# Patient Record
Sex: Male | Born: 1955 | Race: White | Hispanic: No | Smoking: Current every day smoker
Health system: Southern US, Community
[De-identification: ages and names within clinical notes are randomized; demographics above are authoritative.]

## PROBLEM LIST (undated history)

## (undated) DIAGNOSIS — F1910 Other psychoactive substance abuse, uncomplicated: Secondary | ICD-10-CM

## (undated) DIAGNOSIS — J449 Chronic obstructive pulmonary disease, unspecified: Secondary | ICD-10-CM

## (undated) DIAGNOSIS — F17200 Nicotine dependence, unspecified, uncomplicated: Secondary | ICD-10-CM

## (undated) DIAGNOSIS — C801 Malignant (primary) neoplasm, unspecified: Secondary | ICD-10-CM

## (undated) DIAGNOSIS — B182 Chronic viral hepatitis C: Secondary | ICD-10-CM

## (undated) DIAGNOSIS — C140 Malignant neoplasm of pharynx, unspecified: Secondary | ICD-10-CM

## (undated) HISTORY — PX: NO PAST SURGERIES: SHX2092

---

## 2004-05-29 ENCOUNTER — Emergency Department: Payer: Self-pay | Admitting: Unknown Physician Specialty

## 2004-11-22 ENCOUNTER — Emergency Department: Payer: Self-pay

## 2006-04-16 ENCOUNTER — Emergency Department: Payer: Self-pay | Admitting: Emergency Medicine

## 2006-04-16 ENCOUNTER — Other Ambulatory Visit: Payer: Self-pay

## 2006-12-28 ENCOUNTER — Ambulatory Visit: Payer: Self-pay | Admitting: Surgery

## 2014-10-20 ENCOUNTER — Inpatient Hospital Stay
Admission: EM | Admit: 2014-10-20 | Discharge: 2014-10-22 | DRG: 189 | Disposition: A | Discharge status: Home / Self Care | Payer: Self-pay | Attending: Internal Medicine | Admitting: Internal Medicine

## 2014-10-20 ENCOUNTER — Encounter: Payer: Self-pay | Admitting: *Deleted

## 2014-10-20 ENCOUNTER — Emergency Department: Payer: Self-pay

## 2014-10-20 DIAGNOSIS — B1920 Unspecified viral hepatitis C without hepatic coma: Secondary | ICD-10-CM | POA: Diagnosis present

## 2014-10-20 DIAGNOSIS — F17210 Nicotine dependence, cigarettes, uncomplicated: Secondary | ICD-10-CM | POA: Diagnosis present

## 2014-10-20 DIAGNOSIS — J9601 Acute respiratory failure with hypoxia: Principal | ICD-10-CM

## 2014-10-20 DIAGNOSIS — R0902 Hypoxemia: Secondary | ICD-10-CM

## 2014-10-20 DIAGNOSIS — B182 Chronic viral hepatitis C: Secondary | ICD-10-CM

## 2014-10-20 DIAGNOSIS — R768 Other specified abnormal immunological findings in serum: Secondary | ICD-10-CM

## 2014-10-20 DIAGNOSIS — F1010 Alcohol abuse, uncomplicated: Secondary | ICD-10-CM | POA: Diagnosis present

## 2014-10-20 DIAGNOSIS — Z720 Tobacco use: Secondary | ICD-10-CM

## 2014-10-20 DIAGNOSIS — J441 Chronic obstructive pulmonary disease with (acute) exacerbation: Secondary | ICD-10-CM | POA: Diagnosis present

## 2014-10-20 DIAGNOSIS — J209 Acute bronchitis, unspecified: Secondary | ICD-10-CM

## 2014-10-20 DIAGNOSIS — R5383 Other fatigue: Secondary | ICD-10-CM

## 2014-10-20 DIAGNOSIS — J440 Chronic obstructive pulmonary disease with acute lower respiratory infection: Secondary | ICD-10-CM | POA: Diagnosis present

## 2014-10-20 HISTORY — DX: Chronic obstructive pulmonary disease, unspecified: J44.9

## 2014-10-20 LAB — BASIC METABOLIC PANEL
Anion gap: 9 (ref 5–15)
BUN: 10 mg/dL (ref 6–20)
CALCIUM: 9.5 mg/dL (ref 8.9–10.3)
CO2: 33 mmol/L — ABNORMAL HIGH (ref 22–32)
Chloride: 95 mmol/L — ABNORMAL LOW (ref 101–111)
Creatinine, Ser: 0.78 mg/dL (ref 0.61–1.24)
GFR calc non Af Amer: >60 mL/min (ref >60)
Glucose, Bld: 139 mg/dL — ABNORMAL HIGH (ref 65–99)
Potassium: 4.1 mmol/L (ref 3.5–5.1)
Sodium: 137 mmol/L (ref 135–145)

## 2014-10-20 LAB — HEPATIC FUNCTION PANEL
ALBUMIN: 4.5 g/dL (ref 3.5–5.0)
ALT: 20 U/L (ref 17–63)
AST: 20 U/L (ref 15–41)
Alkaline Phosphatase: 76 U/L (ref 38–126)
Bilirubin, Direct: 0.1 mg/dL (ref 0.1–0.5)
Indirect Bilirubin: 0.5 mg/dL (ref 0.3–0.9)
Total Bilirubin: 0.6 mg/dL (ref 0.3–1.2)
Total Protein: 7.5 g/dL (ref 6.5–8.1)

## 2014-10-20 LAB — CBC
HCT: 44.1 % (ref 40.0–52.0)
HEMOGLOBIN: 15.3 g/dL (ref 13.0–18.0)
MCH: 34.4 pg — ABNORMAL HIGH (ref 26.0–34.0)
MCHC: 34.7 g/dL (ref 32.0–36.0)
MCV: 98.9 fL (ref 80.0–100.0)
Platelets: 210 10*3/uL (ref 150–440)
RBC: 4.46 MIL/uL (ref 4.40–5.90)
RDW: 13 % (ref 11.5–14.5)
WBC: 6.2 10*3/uL (ref 3.8–10.6)

## 2014-10-20 LAB — URINALYSIS COMPLETE WITH MICROSCOPIC (ARMC ONLY)
BACTERIA UA: NONE SEEN
Bilirubin Urine: NEGATIVE
GLUCOSE, UA: NEGATIVE mg/dL
HGB URINE DIPSTICK: NEGATIVE
KETONES UR: NEGATIVE mg/dL
Leukocytes, UA: NEGATIVE
NITRITE: NEGATIVE
Protein, ur: NEGATIVE mg/dL
SPECIFIC GRAVITY, URINE: 1.049 — AB (ref 1.005–1.030)
Squamous Epithelial / LPF: NONE SEEN
pH: 6 (ref 5.0–8.0)

## 2014-10-20 MED ORDER — NICOTINE 10 MG IN INHA
1.0000 | RESPIRATORY_TRACT | Status: DC | PRN
  Administered 2014-10-20: 1 via RESPIRATORY_TRACT

## 2014-10-20 MED ORDER — ENOXAPARIN SODIUM 40 MG/0.4ML ~~LOC~~ SOLN
40.0000 mg | SUBCUTANEOUS | Status: DC
  Administered 2014-10-21 – 2014-10-22 (×2): 40 mg via SUBCUTANEOUS
  Filled 2014-10-20 (×2): qty 0.4

## 2014-10-20 MED ORDER — METHYLPREDNISOLONE SODIUM SUCC 125 MG IJ SOLR
125.0000 mg | Freq: Once | INTRAMUSCULAR | Status: AC
  Administered 2014-10-20: 125 mg via INTRAVENOUS
  Filled 2014-10-20: qty 2

## 2014-10-20 MED ORDER — IPRATROPIUM-ALBUTEROL 0.5-2.5 (3) MG/3ML IN SOLN
3.0000 mL | Freq: Once | RESPIRATORY_TRACT | Status: AC
Start: 2014-10-20 — End: 2014-10-20
  Administered 2014-10-20: 3 mL via RESPIRATORY_TRACT
  Filled 2014-10-20: qty 3

## 2014-10-20 MED ORDER — METHYLPREDNISOLONE SODIUM SUCC 125 MG IJ SOLR
60.0000 mg | Freq: Two times a day (BID) | INTRAMUSCULAR | Status: DC
  Administered 2014-10-21 – 2014-10-22 (×4): 60 mg via INTRAVENOUS
  Filled 2014-10-20 (×5): qty 2

## 2014-10-20 MED ORDER — DEXTROSE 5 % IV SOLN
500.0000 mg | INTRAVENOUS | Status: DC
  Administered 2014-10-21: 500 mg via INTRAVENOUS
  Filled 2014-10-20 (×2): qty 500

## 2014-10-20 MED ORDER — ACETAMINOPHEN 650 MG RE SUPP: 650.0000 mg | Freq: Four times a day (QID) | RECTAL | Status: DC | PRN

## 2014-10-20 MED ORDER — IPRATROPIUM-ALBUTEROL 0.5-2.5 (3) MG/3ML IN SOLN
3.0000 mL | Freq: Once | RESPIRATORY_TRACT | Status: AC
  Administered 2014-10-20: 3 mL via RESPIRATORY_TRACT
  Filled 2014-10-20: qty 3

## 2014-10-20 MED ORDER — SODIUM CHLORIDE 0.9 % IJ SOLN
3.0000 mL | Freq: Two times a day (BID) | INTRAMUSCULAR | Status: DC
  Administered 2014-10-21 – 2014-10-22 (×4): 3 mL via INTRAVENOUS

## 2014-10-20 MED ORDER — IPRATROPIUM-ALBUTEROL 0.5-2.5 (3) MG/3ML IN SOLN: 3.0000 mL | RESPIRATORY_TRACT | Status: DC | PRN

## 2014-10-20 MED ORDER — NICOTINE 10 MG IN INHA
RESPIRATORY_TRACT | Status: AC
  Administered 2014-10-20: 1 via RESPIRATORY_TRACT
  Filled 2014-10-20: qty 36

## 2014-10-20 MED ORDER — ACETAMINOPHEN 325 MG PO TABS: 650.0000 mg | ORAL_TABLET | Freq: Four times a day (QID) | ORAL | Status: DC | PRN

## 2014-10-20 MED ORDER — IOHEXOL 350 MG/ML SOLN
75.0000 mL | Freq: Once | INTRAVENOUS | Status: AC | PRN
  Administered 2014-10-20: 75 mL via INTRAVENOUS

## 2014-10-20 NOTE — ED Notes (Signed)
Pt not wanting to wear oxygen tubing not comfortable.

## 2014-10-20 NOTE — ED Notes (Signed)
MD at bedside. 

## 2014-10-20 NOTE — ED Notes (Signed)
Patient transported to CT 

## 2014-10-20 NOTE — ED Provider Notes (Signed)
[facility] Emergency Department Provider Note   ____________________________________________  Time seen: 5:40 PM I have reviewed the triage vital signs and the triage nursing note.  HISTORY  Chief Complaint Weakness   Historian Patient  HPI Stephen Waters is a 59 y.o. male who is complaining of several weeks of feeling weak all over. He does get weak and dyspneic when he walks short distances, and this seems to have been worsening recently. He came mostly today for evaluation of his liver. He states he tried to donate plasma and they told him that he had hepatitis C and to go see a primary care physician. He's been having trouble getting in with a primary care physician due to financial reasons. [facility] but [facility] him too much money, and was referred to [place] and has an appointment at the end of the month. He is not having specific or focal weakness. He has not had any altered mental status. He is not having specific joint problems. He has not had fever. He's been having abdominal pain, nausea, or bowel problems. 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   History reviewed. No pertinent past medical history. told he has hepatitis C  There are no active problems to display for this patient.   No past surgical history on file.  No current outpatient prescriptions on file.  Allergies Review of patient's allergies indicates no known allergies.  No family history on file.  Social History History  Substance Use Topics  . Smoking status: Current Every Day Smoker  . Smokeless tobacco: Not on file  . 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 Eyes: Negative for visual changes. ENT: Negative for sore throat. Cardiovascular: Negative for chest pain. Respiratory:  Positive for some chronic shortness of breath. Chronic cough Gastrointestinal: Negative for abdominal pain, vomiting and diarrhea. Genitourinary: Negative for dysuria. Musculoskeletal: Negative for back pain. Skin: Negative for rash. Neurological: Negative for headaches, focal weakness or numbness. 10 point Review of Systems otherwise negative ____________________________________________   PHYSICAL EXAM:  VITAL SIGNS: ED Triage Vitals  Enc Vitals Group     BP 10/20/14 1635 144/95 mmHg     Pulse Rate 10/20/14 1635 114     Resp 10/20/14 1635 22     Temp 10/20/14 1635 98.2 F (36.8 C)     Temp Source 10/20/14 1635 Oral     SpO2 10/20/14 1635 93 %     Weight 10/20/14 1635 128 lb (58.06 kg)     Height 10/20/14 1635 5\' 10"  (1.778 m)     Head Cir --      Peak Flow --      Pain Score --      Pain Loc --      Pain Edu? --      Excl. in [place]? --      Constitutional: Alert and oriented. Very thin. In no distress. Eyes: Conjunctivae are normal. PERRL. Normal extraocular movements. ENT   Head: Normocephalic and atraumatic.   Nose: No congestion/rhinnorhea.   Mouth/Throat: Mucous membranes are moist.   Neck: No stridor. Cardiovascular/Chest: Normal rate, regular rhythm.  No murmurs, rubs, or gallops. Respiratory: Normal respiratory effort without tachypnea nor retractions. Breath sounds are clear and equal bilaterally. No wheezes/rales/rhonchi. Gastrointestinal: Soft. No distention, no guarding, no rebound. Nontender  Genitourinary/rectal:Deferred Musculoskeletal: Nontender with normal range of motion in all extremities. No joint effusions.  No lower extremity tenderness nor edema. Neurologic:  Normal speech and language. No gross or focal neurologic deficits are appreciated. Skin:  Skin is warm, dry and intact. No rash noted. Psychiatric: Mood and affect are normal. Speech and behavior are normal. Patient exhibits appropriate insight and  judgment.  ____________________________________________   EKG I, Lisa Roca, MD, the attending physician have personally viewed and interpreted all ECGs.  Sinus tachycardia. 110 bpm. Narrow QRS. Normal axis. Normal ST and T-wave ____________________________________________  LABS (pertinent positives/negatives)  White blood count 6.2, hemoglobin 50.3 Hepatic function panel within normal limits Medical panel significant only for cord 95 and CO2 33  ____________________________________________  RADIOLOGY All Xrays were viewed by me. Imaging interpreted by Radiologist.  Chest x-ray portable: COPD no acute findings  CT angio chest:   IMPRESSION: 1. No evidence of acute pulmonary embolism or other acute chest process. 2. Severe emphysema with diffuse central airway thickening. 3. Nonspecific prominent right hilar lymph node, possibly reactive. This can be addressed on follow-up CT below. 4. Scattered small pulmonary nodules are not morphologically suspicious. Given risk factors for bronchogenic carcinoma, follow-up chest CT at 6 - 12 months is recommended. This recommendation follows the consensus statement: Guidelines for Management of Small Pulmonary Nodules Detected on CT Scans: A Statement from the [employer] as published in Radiology 2005;237:395-400. __________________________________________  PROCEDURES  Procedure(s) performed: None Critical Care performed: None  ____________________________________________   ED COURSE / ASSESSMENT AND PLAN  CONSULTATIONS: Hospitalist for admission  Pertinent labs & imaging results that were available during my care of the patient were reviewed by me and considered in my medical decision making (see chart for details).  Patient has generalized complaints of fatigue as difficult to tell whether or not this is a respiratory capacity issue, muscular pain or weakness, but it does not appear that he is having a focal  weakness or numbness and his neurologic exam here is normal. Upon walking his heart rate goes up into the 115-130 range, and his O2 sat dropped to mid 80s and patient was severely short of breath. He does not have active wheezing, however he is a smoker and chest x-ray is consistent with COPD. His chest x-ray does not show an acute finding, and I will go ahead and CT his chest to rule out PE.  Patient was given a DuoNeb after being seen dyspneic, but wheezing after walking, is unclear whether or not this actually helped him. His CT scan did not show PE or pneumonia, but severe emphysema. I am going to go ahead and try additional DuoNeb and Solu-Medrol for a diagnosis of COPD. Patient's need further evaluation and treatment for his hypoxia and severe dyspnea when he does any walking at all.  Patient / Family / Caregiver informed of clinical course, medical decision-making process, and agree with plan.   I discussed return precautions, follow-up instructions, and discharged instructions with patient and/or family.  ___________________________________________   FINAL CLINICAL IMPRESSION(S) / ED DIAGNOSES   Final diagnoses:  COPD exacerbation  Hypoxia  Other fatigue      Lisa Roca, MD 10/20/14 2119

## 2014-10-20 NOTE — H&P (Signed)
[place] [facility] [place] NAME: Stephen Waters    MR#:  [id number]  DATE OF BIRTH:  [birth year]  DATE OF ADMISSION:  10/20/2014  PRIMARY CARE PHYSICIAN: No primary care provider on file.   REQUESTING/REFERRING PHYSICIAN: Lord  CHIEF COMPLAINT:   Chief Complaint  Patient presents with  . Weakness    HISTORY OF PRESENT ILLNESS:  Stephen Waters  is a 59 y.o. male who presents with fatigue. Patient states that he's had intermittent bouts of severe fatigue over the past several weeks, but seemed to be worse when it is humid outside. He is a long-time smoker, and on evaluation in the ED was found to have his O2 sats dropping to the low 80s with ambulation. Chest x-ray and CTA chest show significant emphysematous changes. Patient does not have an official diagnosis by PFTs of COPD, but hospitalists were called for admission for COPD exacerbation, given his clinical scenario. Of note, the patient also states that he recently donated blood and was notified that he had a hepatitis C positive lab tests. He requests further diagnostic workup of this.  PAST MEDICAL HISTORY:   Past Medical History  Diagnosis Date  . COPD (chronic obstructive pulmonary disease)     PAST SURGICAL HISTORY:   Past Surgical History  Procedure Laterality Date  . No past surgeries      SOCIAL HISTORY:   History  Substance Use Topics  . Smoking status: Current Every Day Smoker -- 2.00 packs/day for 40 years    Types: Cigarettes  . Smokeless tobacco: Not on file  . Alcohol Use: 0.0 oz/week    0 Standard drinks or equivalent per week     Comment: 1 beer daily    FAMILY HISTORY:  Patient states that he is not aware of any significant family history of medical illness.  DRUG ALLERGIES:  No Known Allergies  MEDICATIONS AT HOME:   Prior to Admission medications   Not on File    REVIEW OF SYSTEMS:  Review of Systems  Constitutional: Positive for  malaise/fatigue. Negative for fever, chills and weight loss.  HENT: Negative for ear pain, hearing loss and tinnitus.   Eyes: Negative for blurred vision, double vision, pain and redness.  Respiratory: Positive for shortness of breath. Negative for cough and hemoptysis.   Cardiovascular: Negative for chest pain, palpitations, orthopnea and leg swelling.  Gastrointestinal: Negative for nausea, vomiting, abdominal pain, diarrhea and constipation.  Genitourinary: Negative for dysuria, frequency and hematuria.  Musculoskeletal: Negative for back pain, joint pain and neck pain.  Skin:       No acne, rash, or lesions  Neurological: Negative for dizziness, tremors, focal weakness and weakness.  Endo/Heme/Allergies: Negative for polydipsia. Does not bruise/bleed easily.  Psychiatric/Behavioral: Negative for depression. The patient is not nervous/anxious and does not have insomnia.      VITAL SIGNS:   Filed Vitals:   10/20/14 1900 10/20/14 2000 10/20/14 2030 10/20/14 2130  BP: 139/100 137/83 138/79 143/85  Pulse: 94 79 93 69  Temp:      TempSrc:      Resp: 20 20 22    Height:      Weight:      SpO2: 98% 98% 90% 96%   Wt Readings from Last 3 Encounters:  10/20/14 58.06 kg (128 lb)    PHYSICAL EXAMINATION:  Physical Exam  Constitutional: He is oriented to person, place, and time. He appears well-developed and well-nourished. No distress.  HENT:  Head:  Normocephalic and atraumatic.  Mouth/Throat: Oropharynx is clear and moist.  Eyes: Conjunctivae and EOM are normal. Pupils are equal, round, and reactive to light. No scleral icterus.  Neck: Normal range of motion. Neck supple. No JVD present. No thyromegaly present.  Cardiovascular: Normal rate, regular rhythm and intact distal pulses.  Exam reveals no gallop and no friction rub.   No murmur heard. Respiratory: Effort normal. No respiratory distress. He has no wheezes. He has no rales.  Moderate air movement, with somewhat distant breath  sounds.  GI: Soft. Bowel sounds are normal. He exhibits no distension. There is no tenderness.  Musculoskeletal: Normal range of motion. He exhibits no edema.  No arthritis, no gout  Lymphadenopathy:    He has no cervical adenopathy.  Neurological: He is alert and oriented to person, place, and time. No cranial nerve deficit.  No dysarthria, no aphasia  Skin: Skin is warm and dry. No rash noted. No erythema.  Psychiatric: He has a normal mood and affect. His behavior is normal. Judgment and thought content normal.    LABORATORY PANEL:   CBC  Recent Labs Lab 10/20/14 1639  WBC 6.2  HGB 15.3  HCT 44.1  PLT 210   ------------------------------------------------------------------------------------------------------------------  Chemistries   Recent Labs Lab 10/20/14 1639  NA 137  K 4.1  CL 95*  CO2 33*  GLUCOSE 139*  BUN 10  CREATININE 0.78  CALCIUM 9.5  AST 20  ALT 20  ALKPHOS 76  BILITOT 0.6   ------------------------------------------------------------------------------------------------------------------  Cardiac Enzymes No results for input(s): TROPONINI in the last 168 hours. ------------------------------------------------------------------------------------------------------------------  RADIOLOGY:  Dg Chest 2 View  10/20/2014   CLINICAL DATA:  Weakness with fatigue for 6 months or longer, worse today. Chronic cough. Smoker. Initial encounter.  EXAM: CHEST  2 VIEW  COMPARISON:  04/16/2006 radiographs.  FINDINGS: The heart size and mediastinal contours are stable. The lungs are hyperinflated. There is increased interstitial prominence at both lung bases without confluent airspace opacity or pleural effusion. There is a possible small air cyst in the right suprahilar region. The pulmonary vascularity remains normal. Mild superior endplate compression deformities in the upper lumbar spine are unchanged.  IMPRESSION: Chronic obstructive pulmonary disease with  increased interstitial prominence at both lung bases since 2008, likely chronic. No definite edema or superimposed infiltrate.   Electronically Signed   By: Richardean Sale M.D.   On: 10/20/2014 18:38   Ct Angio Chest Pe W/cm &/or Wo Cm  10/20/2014   CLINICAL DATA:  Tachycardia and hypoxemia. Nonproductive cough and weakness. Question pulmonary embolism. Initial encounter.  EXAM: CT ANGIOGRAPHY CHEST WITH CONTRAST  TECHNIQUE: Multidetector CT imaging of the chest was performed using the standard protocol during bolus administration of intravenous contrast. Multiplanar CT image reconstructions and MIPs were obtained to evaluate the vascular anatomy.  CONTRAST:  1mL OMNIPAQUE IOHEXOL 350 MG/ML SOLN  COMPARISON:  Radiographs 10/20/2014 and 04/16/2006.  FINDINGS: Mediastinum: The pulmonary arteries are well opacified with contrast. There is no evidence of acute pulmonary embolism. There is minimal atherosclerosis of the aorta and coronary arteries. The heart size is normal. There is no pericardial effusion. There is a prominent right hilar lymph node, measuring up to 1.5 cm on image 82 of series 5. No enlarged mediastinal or axillary lymph nodes demonstrated. The thyroid gland, trachea and esophagus demonstrate no significant findings.  Lungs/Pleura: There is no pleural effusion.There is severe centrilobular and paraseptal emphysema with diffuse bronchial wall thickening. 5 mm perifissural nodule along the minor fissure on  image 110 is likely a lymph node. There are scattered tiny subpleural nodules in the left lung, likely postinflammatory. No morphologically suspicious nodules or airspace opacity identified.  Upper abdomen: Unremarkable.  There is no adrenal mass.  Musculoskeletal/Chest wall: No chest wall lesion or acute osseous findings.Mild superior endplate compression deformities in the upper lumbar spine are stable from prior chest radiographs. Paucity of subcutaneous fat noted.  Review of the MIP images  confirms the above findings.  IMPRESSION: 1. No evidence of acute pulmonary embolism or other acute chest process. 2. Severe emphysema with diffuse central airway thickening. 3. Nonspecific prominent right hilar lymph node, possibly reactive. This can be addressed on follow-up CT below. 4. Scattered small pulmonary nodules are not morphologically suspicious. Given risk factors for bronchogenic carcinoma, follow-up chest CT at 6 - 12 months is recommended. This recommendation follows the consensus statement: Guidelines for Management of Small Pulmonary Nodules Detected on CT Scans: A Statement from the [employer] as published in Radiology 2005;237:395-400.   Electronically Signed   By: Richardean Sale M.D.   On: 10/20/2014 20:51    EKG:   Orders placed or performed during the hospital encounter of 10/20/14  . ED EKG  . ED EKG    IMPRESSION AND PLAN:  Principal Problem:   COPD exacerbation - without significant wheezing or sputum production, she states has not been coughing too much recently, but that he has had significant fatigue with some intermittent shortness of breath, especially with exertion. His nightly chest pain, his EKG was not suspicious for ischemia, but we'll check a troponin. He was given IV Solu-Medrol and DuoNeb's in the ED with some improvement in his oxygenation status. We will continue these medications and add azithromycin, and then see how well he is able to ambulate tomorrow. Active Problems:   Carrier or suspected carrier of hepatitis C - start hepatitis C antibody, if positive then we'll get hepatitis quantitative viral load.  All the records are reviewed and case discussed with ED provider. Management plans discussed with the patient and/or family.  DVT PROPHYLAXIS: SubQ lovenox  ADMISSION STATUS: Observation  CODE STATUS: Full  TOTAL TIME TAKING CARE OF THIS PATIENT: 40 minutes.    Tan [place] 10/20/2014, 10:18 PM  [facility]   Office  [phone]  CC: Primary care physician; No primary care provider on file.

## 2014-10-20 NOTE — ED Notes (Signed)
Pt to triage via wheelchair.  Pt states he is weak.  States he had to leave work to come to hospital.  Pt states unable to stand for any period of time.  cig smoker.  No chest pain.  Non productive cough.  No fever.  Pt states i just feel weak.

## 2014-10-20 NOTE — ED Notes (Signed)
Walked pt in hall with pulse ox attached. Walked 50 feet heart rate increased from 95 to 107, sats from 92% to 85%. Pt very winded at end. Dr Reita Cliche notified.

## 2014-10-21 LAB — CBC
HCT: 43.8 % (ref 40.0–52.0)
HEMATOCRIT: 44.7 % (ref 40.0–52.0)
Hemoglobin: 15 g/dL (ref 13.0–18.0)
Hemoglobin: 15.3 g/dL (ref 13.0–18.0)
MCH: 34.6 pg — ABNORMAL HIGH (ref 26.0–34.0)
MCH: 33.4 pg (ref 26.0–34.0)
MCHC: 33.5 g/dL (ref 32.0–36.0)
MCHC: 34.8 g/dL (ref 32.0–36.0)
MCV: 99.3 fL (ref 80.0–100.0)
MCV: 99.8 fL (ref 80.0–100.0)
PLATELETS: 195 10*3/uL (ref 150–440)
Platelets: 200 10*3/uL (ref 150–440)
RBC: 4.42 MIL/uL (ref 4.40–5.90)
RBC: 4.48 MIL/uL (ref 4.40–5.90)
RDW: 13.2 % (ref 11.5–14.5)
RDW: 13.3 % (ref 11.5–14.5)
WBC: 4.2 10*3/uL (ref 3.8–10.6)
WBC: 5.7 10*3/uL (ref 3.8–10.6)

## 2014-10-21 LAB — CREATININE, SERUM: Creatinine, Ser: 0.59 mg/dL — ABNORMAL LOW (ref 0.61–1.24)

## 2014-10-21 LAB — BASIC METABOLIC PANEL
Anion gap: 5 (ref 5–15)
BUN: 11 mg/dL (ref 6–20)
CALCIUM: 9.2 mg/dL (ref 8.9–10.3)
CHLORIDE: 100 mmol/L — AB (ref 101–111)
CO2: 32 mmol/L (ref 22–32)
Creatinine, Ser: 0.63 mg/dL (ref 0.61–1.24)
GFR calc Af Amer: >60 mL/min (ref >60)
GFR calc non Af Amer: >60 mL/min (ref >60)
Glucose, Bld: 179 mg/dL — ABNORMAL HIGH (ref 65–99)
Potassium: 4 mmol/L (ref 3.5–5.1)
SODIUM: 137 mmol/L (ref 135–145)

## 2014-10-21 LAB — TROPONIN I: Troponin I: <0.03 ng/mL (ref <0.031)

## 2014-10-21 MED ORDER — DEXTROSE 5 % IV SOLN
500.0000 mg | INTRAVENOUS | Status: DC
  Filled 2014-10-21: qty 500

## 2014-10-21 MED ORDER — TIOTROPIUM BROMIDE MONOHYDRATE 18 MCG IN CAPS
18.0000 ug | ORAL_CAPSULE | Freq: Every day | RESPIRATORY_TRACT | Status: DC
  Administered 2014-10-21 – 2014-10-22 (×2): 18 ug via RESPIRATORY_TRACT
  Filled 2014-10-21: qty 5

## 2014-10-21 MED ORDER — NICOTINE 10 MG IN INHA: 1.0000 | RESPIRATORY_TRACT | Status: DC | PRN

## 2014-10-21 MED ORDER — BUDESONIDE-FORMOTEROL FUMARATE 160-4.5 MCG/ACT IN AERO
2.0000 | INHALATION_SPRAY | Freq: Two times a day (BID) | RESPIRATORY_TRACT | Status: DC
  Administered 2014-10-21 – 2014-10-22 (×3): 2 via RESPIRATORY_TRACT
  Filled 2014-10-21: qty 6

## 2014-10-21 MED ORDER — AZITHROMYCIN 250 MG PO TABS
500.0000 mg | ORAL_TABLET | ORAL | Status: DC
  Administered 2014-10-21: 500 mg via ORAL
  Filled 2014-10-21 (×2): qty 2

## 2014-10-21 MED ORDER — NICOTINE 21 MG/24HR TD PT24
21.0000 mg | MEDICATED_PATCH | Freq: Every day | TRANSDERMAL | Status: DC
  Administered 2014-10-21 – 2014-10-22 (×2): 21 mg via TRANSDERMAL
  Filled 2014-10-21 (×2): qty 1

## 2014-10-21 NOTE — Clinical Social Work Note (Signed)
CSW consulted for financial assistance and follow up MD care for new dx of hep C. CSW has referred this to RN CM. Please reconsult CSW if needed. Shela Leff MSW,LCSWA [phone]

## 2014-10-21 NOTE — Care Management Note (Signed)
Case Management Note  Patient Details  Name: Stephen Waters MRN: [id number] Date of Birth: 05/02/1955  Subjective/Objective:                  Met with patient to discuss discharge planning. Patient recently found out he has HCV and has sought medical assistance in the community for self-pay. [facility] he states turned him down due to "making too much money". He states he pays 100$ weekly rental fee for his home and makes 10$/hour but most goes out in taxes per patient. 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 He states he has an upcoming appointment at [facility] on [place] October 28, 2014. Oxygen is new for patient and states concern with "not being able to work with O2". He is independent with daily activities.  Action/Plan: Encouraged patient to keep appointment on 19th due to recent diagnosis. Encouraged patient to stop smoking. RN attempting to wean patient off O2. Instructed patient that if home O2 is needed he would need to follow up with PCP to see when/if O2 could be discontinued. He states he has about 60$ per week to "live on" including "gas" to get to work. RNCM will continue to monitor Rx at discharge and need for home O2.     Expected Discharge Date:                  Expected Discharge Plan:     In-House Referral:     Discharge planning Services  CM Consult  Post Acute Care Choice:    Choice offered to:  Patient  DME Arranged:    DME Agency:     HH Arranged:    [place] Agency:     Status of Service:     Medicare Important Message Given:    Date Medicare IM Given:    Medicare IM give by:    Date Additional Medicare IM Given:    Additional Medicare Important Message give by:     If discussed at Drain of Stay Meetings, dates discussed:    Additional Comments:  Marshell Garfinkel, RN 10/21/2014, 9:50 AM

## 2014-10-21 NOTE — Consult Note (Signed)
Subjective: Patient seen for recent diagnosis of hepatitis C. Patient seen and examined chart reviewed. Please see full GI consult by Mrs. London. Review with recommendations.  Objective: Vital signs in last 24 hours: Temp:  [97.5 F (36.4 C)-99.4 F (37.4 C)] 99.4 F (37.4 C) (07/12 1954) Pulse Rate:  [74-96] 93 (07/12 1954) Resp:  [17-22] 18 (07/12 1954) BP: (110-139)/(59-87) 117/87 mmHg (07/12 1954) SpO2:  [88 %-96 %] 93 % (07/12 1954) Blood pressure 117/87, pulse 93, temperature 99.4 F (37.4 C), temperature source Oral, resp. rate 18, height 5\' 10"  (1.778 m), weight 58.06 kg (128 lb), SpO2 93 %.   Intake/Output from previous day:    Intake/Output this shift:     General appearance:  Anxious-appearing 59 year old male no acute distress Resp:  Clear to auscultation Cardio:  Regular rate and rhythm GI:  Soft nontender nondistended bowel sounds positive normoactive Extremities:     Lab Results: Results for orders placed or performed during the hospital encounter of 10/20/14 (from the past 24 hour(s))  CBC     Status: None   Collection Time: 10/21/14 12:14 AM  Result Value Ref Range   WBC 5.7 3.8 - 10.6 K/uL   RBC 4.48 4.40 - 5.90 MIL/uL   Hemoglobin 15.0 13.0 - 18.0 g/dL   HCT 44.7 40.0 - 52.0 %   MCV 99.8 80.0 - 100.0 fL   MCH 33.4 26.0 - 34.0 pg   MCHC 33.5 32.0 - 36.0 g/dL   RDW 13.3 11.5 - 14.5 %   Platelets 200 150 - 440 K/uL  Creatinine, serum     Status: Abnormal   Collection Time: 10/21/14 12:14 AM  Result Value Ref Range   Creatinine, Ser 0.59 (L) 0.61 - 1.24 mg/dL   GFR calc non Af Amer >60 >60 mL/min   GFR calc Af Amer >60 >60 mL/min  Troponin I     Status: None   Collection Time: 10/21/14 12:14 AM  Result Value Ref Range   Troponin I <0.03 <0.031 ng/mL  Basic metabolic panel     Status: Abnormal   Collection Time: 10/21/14  5:04 AM  Result Value Ref Range   Sodium 137 135 - 145 mmol/L   Potassium 4.0 3.5 - 5.1 mmol/L   Chloride 100 (L) 101 - 111  mmol/L   CO2 32 22 - 32 mmol/L   Glucose, Bld 179 (H) 65 - 99 mg/dL   BUN 11 6 - 20 mg/dL   Creatinine, Ser 0.63 0.61 - 1.24 mg/dL   Calcium 9.2 8.9 - 10.3 mg/dL   GFR calc non Af Amer >60 >60 mL/min   GFR calc Af Amer >60 >60 mL/min   Anion gap 5 5 - 15  CBC     Status: Abnormal   Collection Time: 10/21/14  5:04 AM  Result Value Ref Range   WBC 4.2 3.8 - 10.6 K/uL   RBC 4.42 4.40 - 5.90 MIL/uL   Hemoglobin 15.3 13.0 - 18.0 g/dL   HCT 43.8 40.0 - 52.0 %   MCV 99.3 80.0 - 100.0 fL   MCH 34.6 (H) 26.0 - 34.0 pg   MCHC 34.8 32.0 - 36.0 g/dL   RDW 13.2 11.5 - 14.5 %   Platelets 195 150 - 440 K/uL      Recent Labs  10/20/14 1639 10/21/14 0014 10/21/14 0504  WBC 6.2 5.7 4.2  HGB 15.3 15.0 15.3  HCT 44.1 44.7 43.8  PLT 210 200 195   BMET  Recent Labs  10/20/14  1639 10/21/14 0014 10/21/14 0504  NA 137  --  137  K 4.1  --  4.0  CL 95*  --  100*  CO2 33*  --  32  GLUCOSE 139*  --  179*  BUN 10  --  11  CREATININE 0.78 0.59* 0.63  CALCIUM 9.5  --  9.2   LFT  Recent Labs  10/20/14 1639  PROT 7.5  ALBUMIN 4.5  AST 20  ALT 20  ALKPHOS 76  BILITOT 0.6  BILIDIR 0.1  IBILI 0.5   PT/INR No results for input(s): LABPROT, INR in the last 72 hours. Hepatitis Panel No results for input(s): HEPBSAG, HCVAB, HEPAIGM, HEPBIGM in the last 72 hours. C-Diff No results for input(s): CDIFFTOX in the last 72 hours. No results for input(s): CDIFFPCR in the last 72 hours.   Studies/Results: Dg Chest 2 View  10/20/2014   CLINICAL DATA:  Weakness with fatigue for 6 months or longer, worse today. Chronic cough. Smoker. Initial encounter.  EXAM: CHEST  2 VIEW  COMPARISON:  04/16/2006 radiographs.  FINDINGS: The heart size and mediastinal contours are stable. The lungs are hyperinflated. There is increased interstitial prominence at both lung bases without confluent airspace opacity or pleural effusion. There is a possible small air cyst in the right suprahilar region. The  pulmonary vascularity remains normal. Mild superior endplate compression deformities in the upper lumbar spine are unchanged.  IMPRESSION: Chronic obstructive pulmonary disease with increased interstitial prominence at both lung bases since 2008, likely chronic. No definite edema or superimposed infiltrate.   Electronically Signed   By: Richardean Sale M.D.   On: 10/20/2014 18:38   Ct Angio Chest Pe W/cm &/or Wo Cm  10/20/2014   CLINICAL DATA:  Tachycardia and hypoxemia. Nonproductive cough and weakness. Question pulmonary embolism. Initial encounter.  EXAM: CT ANGIOGRAPHY CHEST WITH CONTRAST  TECHNIQUE: Multidetector CT imaging of the chest was performed using the standard protocol during bolus administration of intravenous contrast. Multiplanar CT image reconstructions and MIPs were obtained to evaluate the vascular anatomy.  CONTRAST:  66mL OMNIPAQUE IOHEXOL 350 MG/ML SOLN  COMPARISON:  Radiographs 10/20/2014 and 04/16/2006.  FINDINGS: Mediastinum: The pulmonary arteries are well opacified with contrast. There is no evidence of acute pulmonary embolism. There is minimal atherosclerosis of the aorta and coronary arteries. The heart size is normal. There is no pericardial effusion. There is a prominent right hilar lymph node, measuring up to 1.5 cm on image 82 of series 5. No enlarged mediastinal or axillary lymph nodes demonstrated. The thyroid gland, trachea and esophagus demonstrate no significant findings.  Lungs/Pleura: There is no pleural effusion.There is severe centrilobular and paraseptal emphysema with diffuse bronchial wall thickening. 5 mm perifissural nodule along the minor fissure on image 110 is likely a lymph node. There are scattered tiny subpleural nodules in the left lung, likely postinflammatory. No morphologically suspicious nodules or airspace opacity identified.  Upper abdomen: Unremarkable.  There is no adrenal mass.  Musculoskeletal/Chest wall: No chest wall lesion or acute osseous  findings.Mild superior endplate compression deformities in the upper lumbar spine are stable from prior chest radiographs. Paucity of subcutaneous fat noted.  Review of the MIP images confirms the above findings.  IMPRESSION: 1. No evidence of acute pulmonary embolism or other acute chest process. 2. Severe emphysema with diffuse central airway thickening. 3. Nonspecific prominent right hilar lymph node, possibly reactive. This can be addressed on follow-up CT below. 4. Scattered small pulmonary nodules are not morphologically suspicious. Given risk factors  for bronchogenic carcinoma, follow-up chest CT at 6 - 12 months is recommended. This recommendation follows the consensus statement: Guidelines for Management of Small Pulmonary Nodules Detected on CT Scans: A Statement from the [place] as published in Radiology 2005;237:395-400.   Electronically Signed   By: Richardean Sale M.D.   On: 10/20/2014 20:51    Scheduled Inpatient Medications:   . azithromycin  500 mg Oral Q24H  . budesonide-formoterol  2 puff Inhalation BID  . enoxaparin (LOVENOX) injection  40 mg Subcutaneous Q24H  . methylPREDNISolone sodium succinate  60 mg Intravenous Q12H  . nicotine  21 mg Transdermal Daily  . sodium chloride  3 mL Intravenous Q12H  . tiotropium  18 mcg Inhalation Daily    Continuous Inpatient Infusions:     PRN Inpatient Medications:  acetaminophen **OR** acetaminophen, ipratropium-albuterol, nicotine  Miscellaneous:   Assessment:  1. New diagnosis of hepatitis C. Further evaluation as outlined GI consult with GI outpatient follow-up. Other recommendation to discontinue alcohol.  Plan:  1. As above.  Lollie Sails MD 10/21/2014, 10:42 PM

## 2014-10-21 NOTE — Progress Notes (Signed)
[facility] [place] NAME: Stephen Waters    MR#:  [id number]  DATE OF BIRTH:  Mar 31, 1956  SUBJECTIVE:  CHIEF COMPLAINT:   Chief Complaint  Patient presents with  . Weakness   Patient was seen earlier today Patient is 59 year old male who presented with fatigue. He was evaluated by emergency room physician and was found to have O2 sats as low as 80s with ambulation. Chest x-ray that was done as well as CTA of the chest showing emphysema. Patient was noted to be wheezing and having rails on lung exam. He was admitted to the hospital with diagnosis of COPD exacerbation. Smokes 2 packs of cigarettes a day since age of 68, for approximately 67 years calculated expenditure of $140,000 over his lifetime Patient is also concerned about recent diagnosis of hepatitis C he has been going through a lot of difficulties trying to establish physician and the care for his hepatitis C disease, unfortunately, because of insurance and the ability to pay status, he was not able to get care in [facility] department or [facility], now he is requesting some help for treatment of his hepatitis C.   Review of Systems  Constitutional: Negative for fever, chills and weight loss.  HENT: Negative for congestion.   Eyes: Negative for blurred vision and double vision.  Respiratory: Positive for cough, sputum production, shortness of breath and wheezing.   Cardiovascular: Negative for chest pain, palpitations, orthopnea, leg swelling and PND.  Gastrointestinal: Negative for nausea, vomiting, abdominal pain, diarrhea, constipation and blood in stool.  Genitourinary: Negative for dysuria, urgency, frequency and hematuria.  Musculoskeletal: Negative for falls.  Neurological: Negative for dizziness, tremors, focal weakness and headaches.  Endo/Heme/Allergies: Does not bruise/bleed easily.  Psychiatric/Behavioral: Negative for depression. The patient does not have  insomnia.     VITAL SIGNS: Blood pressure 116/71, pulse 90, temperature 98.1 F (36.7 C), temperature source Oral, resp. rate 18, height 5\' 10"  (1.778 m), weight 58.06 kg (128 lb), SpO2 92 %.  PHYSICAL EXAMINATION:   GENERAL:  59 y.o.-year-old patient lying in the bed with no acute distress.  EYES: Pupils equal, round, reactive to light and accommodation. No scleral icterus. Extraocular muscles intact.  HEENT: Head atraumatic, normocephalic. Oropharynx and nasopharynx clear.  NECK:  Supple, no jugular venous distention. No thyroid enlargement, no tenderness.  LUNGS: Diminished breath sounds bilaterally, anterior intermittent, more left-sided wheezing, bilateral posteriorrhonchi , no crepitations. Markedly diminished breath sounds bilaterally posteriorly . Using accessory muscles of respiration on exertion or with longer speech.  CARDIOVASCULAR: S1, S2 normal. 2/6 systolic murmur noted precordially, no rubs, or gallops.  ABDOMEN: Soft, nontender, nondistended. Some guarding in upper abdomen. wel sounds present. No organomegaly or mass.  EXTREMITIES: No pedal edema, cyanosis, or clubbing.  NEUROLOGIC: Cranial nerves II through XII are intact. Muscle strength 5/5 in all extremities. Sensation intact. Gait not checked.  PSYCHIATRIC: The patient is alert and oriented x 3.  SKIN: No obvious rash, lesion, or ulcer.   ORDERS/RESULTS REVIEWED:   CBC  Recent Labs Lab 10/20/14 1639 10/21/14 0014 10/21/14 0504  WBC 6.2 5.7 4.2  HGB 15.3 15.0 15.3  HCT 44.1 44.7 43.8  PLT 210 200 195  MCV 98.9 99.8 99.3  MCH 34.4* 33.4 34.6*  MCHC 34.7 33.5 34.8  RDW 13.0 13.3 13.2   ------------------------------------------------------------------------------------------------------------------  Chemistries   Recent Labs Lab 10/20/14 1639 10/21/14 0014 10/21/14 0504  NA 137  --  137  K 4.1  --  4.0  CL 95*  --  100*  CO2 33*  --  32  GLUCOSE 139*  --  179*  BUN 10  --  11  CREATININE 0.78  0.59* 0.63  CALCIUM 9.5  --  9.2  AST 20  --   --   ALT 20  --   --   ALKPHOS 76  --   --   BILITOT 0.6  --   --    ------------------------------------------------------------------------------------------------------------------ estimated creatinine clearance is 81.7 mL/min (by C-G formula based on Cr of 0.63). ------------------------------------------------------------------------------------------------------------------ No results for input(s): TSH, T4TOTAL, T3FREE, THYROIDAB in the last 72 hours.  Invalid input(s): FREET3  Cardiac Enzymes  Recent Labs Lab 10/21/14 0014  TROPONINI <0.03   ------------------------------------------------------------------------------------------------------------------ Invalid input(s): POCBNP ---------------------------------------------------------------------------------------------------------------  RADIOLOGY: Dg Chest 2 View  10/20/2014   CLINICAL DATA:  Weakness with fatigue for 6 months or longer, worse today. Chronic cough. Smoker. Initial encounter.  EXAM: CHEST  2 VIEW  COMPARISON:  04/16/2006 radiographs.  FINDINGS: The heart size and mediastinal contours are stable. The lungs are hyperinflated. There is increased interstitial prominence at both lung bases without confluent airspace opacity or pleural effusion. There is a possible small air cyst in the right suprahilar region. The pulmonary vascularity remains normal. Mild superior endplate compression deformities in the upper lumbar spine are unchanged.  IMPRESSION: Chronic obstructive pulmonary disease with increased interstitial prominence at both lung bases since 2008, likely chronic. No definite edema or superimposed infiltrate.   Electronically Signed   By: Richardean Sale M.D.   On: 10/20/2014 18:38   Ct Angio Chest Pe W/cm &/or Wo Cm  10/20/2014   CLINICAL DATA:  Tachycardia and hypoxemia. Nonproductive cough and weakness. Question pulmonary embolism. Initial encounter.  EXAM: CT  ANGIOGRAPHY CHEST WITH CONTRAST  TECHNIQUE: Multidetector CT imaging of the chest was performed using the standard protocol during bolus administration of intravenous contrast. Multiplanar CT image reconstructions and MIPs were obtained to evaluate the vascular anatomy.  CONTRAST:  55mL OMNIPAQUE IOHEXOL 350 MG/ML SOLN  COMPARISON:  Radiographs 10/20/2014 and 04/16/2006.  FINDINGS: Mediastinum: The pulmonary arteries are well opacified with contrast. There is no evidence of acute pulmonary embolism. There is minimal atherosclerosis of the aorta and coronary arteries. The heart size is normal. There is no pericardial effusion. There is a prominent right hilar lymph node, measuring up to 1.5 cm on image 82 of series 5. No enlarged mediastinal or axillary lymph nodes demonstrated. The thyroid gland, trachea and esophagus demonstrate no significant findings.  Lungs/Pleura: There is no pleural effusion.There is severe centrilobular and paraseptal emphysema with diffuse bronchial wall thickening. 5 mm perifissural nodule along the minor fissure on image 110 is likely a lymph node. There are scattered tiny subpleural nodules in the left lung, likely postinflammatory. No morphologically suspicious nodules or airspace opacity identified.  Upper abdomen: Unremarkable.  There is no adrenal mass.  Musculoskeletal/Chest wall: No chest wall lesion or acute osseous findings.Mild superior endplate compression deformities in the upper lumbar spine are stable from prior chest radiographs. Paucity of subcutaneous fat noted.  Review of the MIP images confirms the above findings.  IMPRESSION: 1. No evidence of acute pulmonary embolism or other acute chest process. 2. Severe emphysema with diffuse central airway thickening. 3. Nonspecific prominent right hilar lymph node, possibly reactive. This can be addressed on follow-up CT below. 4. Scattered small pulmonary nodules are not morphologically suspicious. Given risk factors for  bronchogenic carcinoma, follow-up chest CT at 6 - 12 months  is recommended. This recommendation follows the consensus statement: Guidelines for Management of Small Pulmonary Nodules Detected on CT Scans: A Statement from the [place] as published in Radiology 2005;237:395-400.   Electronically Signed   By: Richardean Sale M.D.   On: 10/20/2014 20:51    EKG:  Orders placed or performed during the hospital encounter of 10/20/14  . ED EKG  . ED EKG  . EKG 12-Lead  . EKG 12-Lead    ASSESSMENT AND PLAN:  Principal Problem:   COPD exacerbation Active Problems:   Carrier or suspected carrier of hepatitis C 1. Acute respiratory failure with hypoxia due to COPD exacerbation, continue patient on nebulizers at the [facility] as well as tiotropium, continue steroids, continue oxygen therapy as needed, keeping his pulse oximetry at around 94% and above 2. Acute bronchitis. Continue patient on erythromycin. Get sputum cultures if possible 3. COPD exacerbation as above. We'll continue steroids, inhalation therapy, nebulizers, follow clinically 4. Smoking counseling. Discussed this patient for approximately 5 minutes. Nicotine replacement therapy will be initiated for which patient is agreeable 5. History of hepatitis C infection. Will get gastroenterologist involved for further recommendations and possibly help. We will ask social workers care managers involved with help of establishing physician who treats hepatitis C infections. Questionable Dr. Ola Spurr versus gastroenterologist, Dr. Gustavo Lah Management plans discussed with the patient, family and they are in agreement.   DRUG ALLERGIES: No Known Allergies  CODE STATUS:     Code Status Orders        Start     Ordered   10/20/14 2318  Full code   Continuous     10/20/14 2317      TOTAL TIME TAKING CARE OF THIS PATIENT: 45 nutes.    Theodoro Grist M.D on 10/21/2014 at 6:58 PM  Between 7am to 6pm - Pager - [phone]  After  6pm go to [email or web address] - password [email or web address] [facility]  [facility]  Office  [phone]  CC: Primary care physician; No primary care provider on file.

## 2014-10-21 NOTE — Consult Note (Signed)
Consultation  Referring Provider:  Dr Ether Griffins   Admit date7/11/16 Consult date     10/21/14    Reason for Consultation:     HCV         HPI:   Stephen Waters is a 59 y.o. male admitted with COPD exacerbation. 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 Says he found this out when he tried to donate blood at a center on [address], can't remember name. Tried to have this followed up with at the health department and the [facility], but was unsuccessful. Has appt on July 19th to discuss this at [facility]. States his co-workers are worried that he will infect them. States he is not having any abdominal pain, NVD, melena/ hematochezia, dyspepsia, nor any other GI related complaint. [employer] reports he had a brief illness with jaundice as a teenager. Unsure if this was a viral hepatitis or not. Says it resolved after a couple of days. Has tattoos. Admits to being incarcerated for DWI at one time, endorses a beer or 2 after work on a qd basis. Denies intranasal cocaine/IVDU, sex partners with viral hepatitis, blood transfusion, dialysis, military/health care work. No family history of liver disease. Never has been overseas.    Past Medical History  Diagnosis Date  . COPD (chronic obstructive pulmonary disease)     Past Surgical History  Procedure Laterality Date  . No past surgeries      History reviewed. No pertinent family history. There is no family history of liver disease.  History  Substance Use Topics  . Smoking status: Current Every Day Smoker -- 2.00 packs/day for 40 years    Types: Cigarettes  . Smokeless tobacco: Not on file  . Alcohol Use: 0.0 oz/week    0 Standard drinks or equivalent per week     Comment: 1 beer daily    Prior to Admission medications   Not on File    Current Facility-Administered Medications  Medication Dose Route Frequency Provider Last Rate Last Dose  . acetaminophen (TYLENOL) tablet 650 mg  650 mg  Oral Q6H PRN Lance Coon, MD       Or  . acetaminophen (TYLENOL) suppository 650 mg  650 mg Rectal Q6H PRN Lance Coon, MD      . azithromycin (ZITHROMAX) 500 mg in dextrose 5 % 250 mL IVPB  500 mg Intravenous Q24H Theodoro Grist, MD      . budesonide-formoterol (SYMBICORT) 160-4.5 MCG/ACT inhaler 2 puff  2 puff Inhalation BID Theodoro Grist, MD   2 puff at 10/21/14 1000  . enoxaparin (LOVENOX) injection 40 mg  40 mg Subcutaneous Q24H Lance Coon, MD   40 mg at 10/21/14 0041  . ipratropium-albuterol (DUONEB) 0.5-2.5 (3) MG/3ML nebulizer solution 3 mL  3 mL Nebulization Q4H PRN Lance Coon, MD      . methylPREDNISolone sodium succinate (SOLU-MEDROL) 125 mg/2 mL injection 60 mg  60 mg Intravenous Q12H Lance Coon, MD   60 mg at 10/21/14 0959  . nicotine (NICODERM CQ - dosed in mg/24 hours) patch 21 mg  21 mg Transdermal Daily Theodoro Grist, MD   21 mg at 10/21/14 0959  . nicotine (NICOTROL) 10 MG inhaler 1 continuous puffing  1 continuous puffing Inhalation PRN Lisa Roca, MD   1 continuous puffing at 10/20/14 2130  . sodium chloride 0.9 % injection 3 mL  3 mL Intravenous Q12H Lance Coon, MD   3 mL at 10/21/14 1001  .  tiotropium (SPIRIVA) inhalation capsule 18 mcg  18 mcg Inhalation Daily Theodoro Grist, MD   18 mcg at 10/21/14 1000    Allergies as of 10/20/2014  . (No Known Allergies)     Review of Systems:    All systems reviewed and negative except where noted in HPI, with the exception of sob, fatigue, anxiety over health, insomnia.      Physical Exam:  Vital signs in last 24 hours: Temp:  [97.5 F (36.4 C)-98.2 F (36.8 C)] 98.1 F (36.7 C) (07/12 1443) Pulse Rate:  [69-114] 90 (07/12 1443) Resp:  [17-22] 18 (07/12 1443) BP: (110-144)/(59-100) 116/71 mmHg (07/12 1443) SpO2:  [87 %-98 %] 92 % (07/12 1443) Weight:  [58.06 kg (128 lb)] 58.06 kg (128 lb) (07/11 1635) Last BM Date: 10/20/14 General:   Pleasant man in NAD Head:  Normocephalic and atraumatic. Eyes:   No  icterus.   Conjunctiva pink. Ears:  Normal auditory acuity. Mouth: Mucosa pink moist, no lesions. Neck:  Supple; no masses felt Lungs:  Respirations even and unlabored. Lungs clear to auscultation bilaterally.   No wheezes, crackles, or rhonchi.  Heart:  S1S2, RRR, no MRG. No edema. Abdomen:   Flat, soft, nondistended, nontender. Normal bowel sounds. No appreciable masses or hepatomegaly. No rebound signs or other peritoneal signs.  Msk:  MAEW x4, No clubbing or cyanosis. Strength 5/5. Symmetrical without gross deformities. Neurologic:  Alert and  oriented x4;  Cranial nerves II-XII intact.  Skin:  Warm, dry, pink without significant lesions or rashes. Psych:  Alert and cooperative. Normal affect.  LAB RESULTS:  Recent Labs  10/20/14 1639 10/21/14 0014 10/21/14 0504  WBC 6.2 5.7 4.2  HGB 15.3 15.0 15.3  HCT 44.1 44.7 43.8  PLT 210 200 195   BMET  Recent Labs  10/20/14 1639 10/21/14 0014 10/21/14 0504  NA 137  --  137  K 4.1  --  4.0  CL 95*  --  100*  CO2 33*  --  32  GLUCOSE 139*  --  179*  BUN 10  --  11  CREATININE 0.78 0.59* 0.63  CALCIUM 9.5  --  9.2   LFT  Recent Labs  10/20/14 1639  PROT 7.5  ALBUMIN 4.5  AST 20  ALT 20  ALKPHOS 76  BILITOT 0.6  BILIDIR 0.1  IBILI 0.5   PT/INR No results for input(s): LABPROT, INR in the last 72 hours.  STUDIES: Dg Chest 2 View  10/20/2014   CLINICAL DATA:  Weakness with fatigue for 6 months or longer, worse today. Chronic cough. Smoker. Initial encounter.  EXAM: CHEST  2 VIEW  COMPARISON:  04/16/2006 radiographs.  FINDINGS: The heart size and mediastinal contours are stable. The lungs are hyperinflated. There is increased interstitial prominence at both lung bases without confluent airspace opacity or pleural effusion. There is a possible small air cyst in the right suprahilar region. The pulmonary vascularity remains normal. Mild superior endplate compression deformities in the upper lumbar spine are unchanged.   IMPRESSION: Chronic obstructive pulmonary disease with increased interstitial prominence at both lung bases since 2008, likely chronic. No definite edema or superimposed infiltrate.   Electronically Signed   By: Richardean Sale M.D.   On: 10/20/2014 18:38   Ct Angio Chest Pe W/cm &/or Wo Cm  10/20/2014   CLINICAL DATA:  Tachycardia and hypoxemia. Nonproductive cough and weakness. Question pulmonary embolism. Initial encounter.  EXAM: CT ANGIOGRAPHY CHEST WITH CONTRAST  TECHNIQUE: Multidetector CT imaging of the chest was  performed using the standard protocol during bolus administration of intravenous contrast. Multiplanar CT image reconstructions and MIPs were obtained to evaluate the vascular anatomy.  CONTRAST:  77mL OMNIPAQUE IOHEXOL 350 MG/ML SOLN  COMPARISON:  Radiographs 10/20/2014 and 04/16/2006.  FINDINGS: Mediastinum: The pulmonary arteries are well opacified with contrast. There is no evidence of acute pulmonary embolism. There is minimal atherosclerosis of the aorta and coronary arteries. The heart size is normal. There is no pericardial effusion. There is a prominent right hilar lymph node, measuring up to 1.5 cm on image 82 of series 5. No enlarged mediastinal or axillary lymph nodes demonstrated. The thyroid gland, trachea and esophagus demonstrate no significant findings.  Lungs/Pleura: There is no pleural effusion.There is severe centrilobular and paraseptal emphysema with diffuse bronchial wall thickening. 5 mm perifissural nodule along the minor fissure on image 110 is likely a lymph node. There are scattered tiny subpleural nodules in the left lung, likely postinflammatory. No morphologically suspicious nodules or airspace opacity identified.  Upper abdomen: Unremarkable.  There is no adrenal mass.  Musculoskeletal/Chest wall: No chest wall lesion or acute osseous findings.Mild superior endplate compression deformities in the upper lumbar spine are stable from prior chest radiographs. Paucity  of subcutaneous fat noted.  Review of the MIP images confirms the above findings.  IMPRESSION: 1. No evidence of acute pulmonary embolism or other acute chest process. 2. Severe emphysema with diffuse central airway thickening. 3. Nonspecific prominent right hilar lymph node, possibly reactive. This can be addressed on follow-up CT below. 4. Scattered small pulmonary nodules are not morphologically suspicious. Given risk factors for bronchogenic carcinoma, follow-up chest CT at 6 - 12 months is recommended. This recommendation follows the consensus statement: Guidelines for Management of Small Pulmonary Nodules Detected on CT Scans: A Statement from the [place] as published in Radiology 2005;237:395-400.   Electronically Signed   By: Richardean Sale M.D.   On: 10/20/2014 20:51       Impression / Plan:   1. Report of positive HCV antibody test. There is a confirmatory test pending.  Has risk factors of tattoos and prior incarceration. Agree with repeat of test and assessing viral load if positive. Will also need liver US, genotype, and assessment for other forms of viral hepatitis if this is positive. LFts and platelets have been normal. Did discuss this presumptive diagnosis, further evaluation, and possible treatment if he is found to have this.  Additionally, it sounds like he may have some former/current alcohol abuse issues- have counselled the need to abstain for treatment/liver health with rationale.  He should keep his appointment with [facility] care on 19 July pending his release.  Thank you very much for this consult. These services were provided by Stephens November, NP-C, in collaboration with Lollie Sails, MD, with whom I have discussed this patient in full.   Stephens November, NP-C

## 2014-10-22 ENCOUNTER — Inpatient Hospital Stay: Payer: Self-pay

## 2014-10-22 DIAGNOSIS — Z720 Tobacco use: Secondary | ICD-10-CM

## 2014-10-22 DIAGNOSIS — J209 Acute bronchitis, unspecified: Secondary | ICD-10-CM

## 2014-10-22 DIAGNOSIS — J9601 Acute respiratory failure with hypoxia: Secondary | ICD-10-CM

## 2014-10-22 LAB — HEPATITIS C ANTIBODY: HCV Ab: >11 s/co ratio — ABNORMAL HIGH (ref 0.0–0.9)

## 2014-10-22 MED ORDER — ALBUTEROL SULFATE (2.5 MG/3ML) 0.083% IN NEBU
2.5000 mg | INHALATION_SOLUTION | RESPIRATORY_TRACT | Status: DC
Start: 2014-10-22 — End: 2014-10-22
  Filled 2014-10-22: qty 3

## 2014-10-22 MED ORDER — TIOTROPIUM BROMIDE MONOHYDRATE 18 MCG IN CAPS: 18.0000 ug | ORAL_CAPSULE | Freq: Every day | RESPIRATORY_TRACT | Status: DC

## 2014-10-22 MED ORDER — PREDNISONE 5 MG PO TABS: ORAL_TABLET | ORAL | Status: DC

## 2014-10-22 MED ORDER — NICOTINE 10 MG IN INHA: 1.0000 | RESPIRATORY_TRACT | Status: DC | PRN

## 2014-10-22 MED ORDER — ALBUTEROL SULFATE (2.5 MG/3ML) 0.083% IN NEBU: 2.5000 mg | INHALATION_SOLUTION | RESPIRATORY_TRACT | Status: DC | PRN

## 2014-10-22 MED ORDER — IPRATROPIUM-ALBUTEROL 0.5-2.5 (3) MG/3ML IN SOLN: 3.0000 mL | RESPIRATORY_TRACT | Status: DC | PRN

## 2014-10-22 MED ORDER — IPRATROPIUM-ALBUTEROL 0.5-2.5 (3) MG/3ML IN SOLN: 3.0000 mL | RESPIRATORY_TRACT | Status: DC

## 2014-10-22 MED ORDER — ALBUTEROL SULFATE (2.5 MG/3ML) 0.083% IN NEBU: 2.5000 mg | INHALATION_SOLUTION | RESPIRATORY_TRACT | Status: DC

## 2014-10-22 MED ORDER — BUDESONIDE-FORMOTEROL FUMARATE 160-4.5 MCG/ACT IN AERO: 2.0000 | INHALATION_SPRAY | Freq: Two times a day (BID) | RESPIRATORY_TRACT | Status: DC

## 2014-10-22 MED ORDER — AZITHROMYCIN 250 MG PO TABS: 250.0000 mg | ORAL_TABLET | Freq: Every day | ORAL | Status: DC

## 2014-10-22 MED ORDER — NICOTINE 21 MG/24HR TD PT24: 21.0000 mg | MEDICATED_PATCH | Freq: Every day | TRANSDERMAL | Status: DC

## 2014-10-22 NOTE — Care Management (Addendum)
No O2 needs. Patient scheduled to establish with [facility] 10/28/14 per patient. Discharge instructions faxed to [facility] faxed ext [phone] to see if they can help patient with any Rx. Application left in room for patient to take Rx's along with this application to address on application (across street from [facility]). No further RNCM needs. Case closed.

## 2014-10-22 NOTE — Discharge Summary (Signed)
[facility] [place] NAME: Stephen Waters    MR#:  [id number]  DATE OF BIRTH:  1955/07/27  DATE OF ADMISSION:  10/20/2014 ADMITTING PHYSICIAN: Lance Coon, MD  DATE OF DISCHARGE: No discharge date for patient encounter.  PRIMARY CARE PHYSICIAN: No primary care provider on file.     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 COPD (chronic obstructive pulmonary disease)     .pro HOSPITAL COURSE:   Patient is 59 year old male who presented with fatigue. He was evaluated by emergency room physician and was found to have O2 sats as low as 80s with ambulation. Chest x-ray that was done as well as CTA of the chest showing emphysema. Patient was noted to be wheezing and having rails on lung exam. He was admitted to the hospital with diagnosis of COPD exacerbation. Smokes 2 packs of cigarettes a day since age of 89, for approximately 75 years calculated expenditure of $140,000 over his lifetime. Patient is feeling better today, less shortness of breath, some cough but no significant sputum production. Oxygen saturations are 95% on room air at rest at nighttime. O2 sats are pending on exertion on room air. Would like to go home Patient is also concerned about recent diagnosis of hepatitis C he has been going through a lot of difficulties trying to establish physician and the care for his hepatitis C disease, unfortunately, because of insurance and the ability to pay status, he was not able to get care in health department or [facility], now he is requesting some help for treatment of his hepatitis C. Patient was seen by  gastroenterologist, Dr. Gustavo Lah and recommended to Follow-Up with Him As Outpatient for Further Management of His Hepatitis C.  Discussion by problem 1. Acute respiratory failure with hypoxia due to COPD exacerbation, continue patient on nebulizers, Symbicort as well as tiotropium, taper steroids, follow oxygenation on room air on exertion 2. Acute bronchitis. Continue patient on erythromycin. Unable to get sputum cultures, but clinically improving 3. COPD exacerbation as above. We'll continue steroids, inhalation therapy, nebulizers, improving clinically, follow-up with [facility] for further recommendations. Patient may benefit from a pulmonary function test as outpatient whenever he is finished with his acute infection. He may also benefit from pulmonology consultation.  4. Smoking counseling. Discussed this patient for approximately 5 minutes yesterday. Nicotine replacement therapy  initiated for which patient is agreeable, continued at home 5. History of hepatitis C infection. Appreciate gastroenterologist input. Patient is to follow up with gastroenterologist as outpatient 6. Generalized weakness. We will prescribe home health physical therapy as well as RN  DISCHARGE CONDITIONS:   fair  CONSULTS OBTAINED:  Treatment Team:  Lollie Sails, MD  DRUG ALLERGIES:  No Known Allergies  DISCHARGE MEDICATIONS:   Current Discharge Medication List    START taking these medications   Details  azithromycin (ZITHROMAX) 250 MG tablet Take 1 tablet (250 mg total) by mouth daily. Qty: 4 each, Refills: 0    budesonide-formoterol (SYMBICORT) 160-4.5 MCG/ACT inhaler Inhale 2 puffs into the lungs 2 (two) times daily. Qty: 1 Inhaler, Refills: 12    ipratropium-albuterol (DUONEB) 0.5-2.5 (3) MG/3ML SOLN  Take 3 mLs by nebulization every 4 (four) hours as needed. Qty: 360 mL, Refills: 3    nicotine (NICODERM CQ - DOSED IN MG/24 HOURS) 21 mg/24hr patch Place 1 patch (21 mg total) onto  the skin daily. Qty: 28 patch, Refills: 0    nicotine (NICOTROL) 10 MG inhaler Inhale 1 cartridge (1 continuous puffing total) into the lungs as needed for smoking cessation. Qty: 42 each, Refills: 0    predniSONE (DELTASONE) 5 MG tablet Label  & dispense according to the schedule below. 10 Pills PO for 3 days then, 8 Pills PO for 3 days, 6 Pills PO for 3 days, 4 Pills PO for 3 days, 2 Pills PO for 3 days, 1 Pills PO for 3 days, 1/2 Pill  PO for 3 days then STOP. Total 95 pills. Qty: 95 tablet, Refills: 0    tiotropium (SPIRIVA) 18 MCG inhalation capsule Place 1 capsule (18 mcg total) into inhaler and inhale daily. Qty: 30 capsule, Refills: 12         DISCHARGE INSTRUCTIONS:    Please follow up with [facility] in 1 week, also DR Gustavo Lah for Hep C treatment, if needed, thank you  If you experience worsening of your admission symptoms, develop shortness of breath, life threatening emergency, suicidal or homicidal thoughts you must seek medical attention immediately by calling 911 or calling your MD immediately  if symptoms less severe.  You Must read complete instructions/literature along with all the possible adverse reactions/side effects for all the Medicines you take and that have been prescribed to you. Take any new Medicines after you have completely understood and accept all the possible adverse reactions/side effects.   Please note  You were cared for by a hospitalist during your hospital stay. If you have any questions about your discharge medications or the care you received while you were in the hospital after you are discharged, you can call the unit and asked to speak with the hospitalist on call if the hospitalist that took care of you is not available. Once you are discharged, your primary care physician will handle any further medical issues. Please note that NO REFILLS for any discharge medications will be authorized once you are discharged, as it is imperative  that you return to your primary care physician (or establish a relationship with a primary care physician if you do not have one) for your aftercare needs so that they can reassess your need for medications and monitor your lab values.    Today   CHIEF COMPLAINT:   Chief Complaint  Patient presents with  . Weakness    HISTORY OF PRESENT ILLNESS:  Stephen Waters  is a 59 y.o. male with a known history of smoking, alcohol abuse who presents to the hospital with generalized weakness and was noted to have oxygen saturations in 80s. Chest x-ray that was done as well as CTA of the chest showing emphysema. Patient was noted to be wheezing and having rails on lung exam. He was admitted to the hospital with diagnosis of COPD exacerbation. Smokes 2 packs of cigarettes a day since age of 51, for approximately 24 years calculated expenditure of $140,000 over his lifetime. Patient is feeling better today, less shortness of breath, some cough but no significant sputum production. Oxygen saturations are 95% on room air at rest at nighttime. O2 sats are pending on exertion on room air. Would like to go home Patient is also concerned about recent diagnosis of hepatitis C he has  been going through a lot of difficulties trying to establish physician and the care for his hepatitis C disease, unfortunately, because of insurance and the ability to pay status, he was not able to get care in health department or [facility], now he is requesting some help for treatment of his hepatitis C. Patient was seen by gastroenterologist, Dr. Gustavo Lah and recommended to Follow-Up with Him As Outpatient for Further Management of His Hepatitis C.  Discussion by problem 1. Acute respiratory failure with hypoxia due to COPD exacerbation, continue patient on nebulizers, Symbicort as well as tiotropium, taper steroids, follow oxygenation on room air on exertion 2. Acute bronchitis. Continue patient on erythromycin. Unable to get  sputum cultures, but clinically improving 3. COPD exacerbation as above. We'll continue steroids, inhalation therapy, nebulizers, improving clinically, follow-up with [facility] for further recommendations. Patient may benefit from a pulmonary function test as outpatient whenever he is finished with his acute infection. He may also benefit from pulmonology consultation.  4. Smoking counseling. Discussed this patient for approximately 5 minutes yesterday. Nicotine replacement therapy  initiated for which patient is agreeable, continued at home 5. History of hepatitis C infection. Appreciate gastroenterologist input. Patient is to follow up with gastroenterologist as outpatient 6. Generalized weakness. We will prescribe home health physical therapy as well as RN   VITAL SIGNS:  Blood pressure 121/70, pulse 76, temperature 98.5 F (36.9 C), temperature source Oral, resp. rate 16, height 5\' 10"  (1.778 m), weight 58.06 kg (128 lb), SpO2 90 %.  I/O:   Intake/Output Summary (Last 24 hours) at 10/22/14 0955 Last data filed at 10/21/14 2300  Gross per 24 hour  Intake    960 ml  Output      0 ml  Net    960 ml    PHYSICAL EXAMINATION:  GENERAL:  59 y.o.-year-old patient lying in the bed with no acute distress. Restless and talkative and anxious EYES: Pupils equal, round, reactive to light and accommodation. No scleral icterus. Extraocular muscles intact.  HEENT: Head atraumatic, normocephalic. Oropharynx and nasopharynx clear.  NECK:  Supple, no jugular venous distention. No thyroid enlargement, no tenderness.  LUNGS: Diminished breath sounds bilaterally, no wheezing, but bilateral anterior as well as posterior rhonchi noted with no crepitation. No use of accessory muscles of respiration. We'll to speak long sentences symptomatic comfortable but restless CARDIOVASCULAR: S1, S2 normal. No murmurs, rubs, or gallops.  ABDOMEN: Soft, non-tender, non-distended. Bowel sounds present. No  organomegaly or mass.  EXTREMITIES: No pedal edema, cyanosis, or clubbing.  NEUROLOGIC: Cranial nerves II through XII are intact. Muscle strength 5/5 in all extremities. Sensation intact. Gait not checked.  PSYCHIATRIC: The patient is alert and oriented x 3.  SKIN: No obvious rash, lesion, or ulcer.   DATA REVIEW:   CBC  Recent Labs Lab 10/21/14 0504  WBC 4.2  HGB 15.3  HCT 43.8  PLT 195    Chemistries   Recent Labs Lab 10/20/14 1639  10/21/14 0504  NA 137  --  137  K 4.1  --  4.0  CL 95*  --  100*  CO2 33*  --  32  GLUCOSE 139*  --  179*  BUN 10  --  11  CREATININE 0.78  < > 0.63  CALCIUM 9.5  --  9.2  AST 20  --   --   ALT 20  --   --   ALKPHOS 76  --   --   BILITOT 0.6  --   --   < > =  values in this interval not displayed.  Cardiac Enzymes  Recent Labs Lab 10/21/14 0014  TROPONINI <0.03    Microbiology Results  No results found for this or any previous visit.  RADIOLOGY:  Dg Chest 2 View  10/20/2014   CLINICAL DATA:  Weakness with fatigue for 6 months or longer, worse today. Chronic cough. Smoker. Initial encounter.  EXAM: CHEST  2 VIEW  COMPARISON:  04/16/2006 radiographs.  FINDINGS: The heart size and mediastinal contours are stable. The lungs are hyperinflated. There is increased interstitial prominence at both lung bases without confluent airspace opacity or pleural effusion. There is a possible small air cyst in the right suprahilar region. The pulmonary vascularity remains normal. Mild superior endplate compression deformities in the upper lumbar spine are unchanged.  IMPRESSION: Chronic obstructive pulmonary disease with increased interstitial prominence at both lung bases since 2008, likely chronic. No definite edema or superimposed infiltrate.   Electronically Signed   By: Richardean Sale M.D.   On: 10/20/2014 18:38   Ct Angio Chest Pe W/cm &/or Wo Cm  10/20/2014   CLINICAL DATA:  Tachycardia and hypoxemia. Nonproductive cough and weakness. Question  pulmonary embolism. Initial encounter.  EXAM: CT ANGIOGRAPHY CHEST WITH CONTRAST  TECHNIQUE: Multidetector CT imaging of the chest was performed using the standard protocol during bolus administration of intravenous contrast. Multiplanar CT image reconstructions and MIPs were obtained to evaluate the vascular anatomy.  CONTRAST:  63mL OMNIPAQUE IOHEXOL 350 MG/ML SOLN  COMPARISON:  Radiographs 10/20/2014 and 04/16/2006.  FINDINGS: Mediastinum: The pulmonary arteries are well opacified with contrast. There is no evidence of acute pulmonary embolism. There is minimal atherosclerosis of the aorta and coronary arteries. The heart size is normal. There is no pericardial effusion. There is a prominent right hilar lymph node, measuring up to 1.5 cm on image 82 of series 5. No enlarged mediastinal or axillary lymph nodes demonstrated. The thyroid gland, trachea and esophagus demonstrate no significant findings.  Lungs/Pleura: There is no pleural effusion.There is severe centrilobular and paraseptal emphysema with diffuse bronchial wall thickening. 5 mm perifissural nodule along the minor fissure on image 110 is likely a lymph node. There are scattered tiny subpleural nodules in the left lung, likely postinflammatory. No morphologically suspicious nodules or airspace opacity identified.  Upper abdomen: Unremarkable.  There is no adrenal mass.  Musculoskeletal/Chest wall: No chest wall lesion or acute osseous findings.Mild superior endplate compression deformities in the upper lumbar spine are stable from prior chest radiographs. Paucity of subcutaneous fat noted.  Review of the MIP images confirms the above findings.  IMPRESSION: 1. No evidence of acute pulmonary embolism or other acute chest process. 2. Severe emphysema with diffuse central airway thickening. 3. Nonspecific prominent right hilar lymph node, possibly reactive. This can be addressed on follow-up CT below. 4. Scattered small pulmonary nodules are not  morphologically suspicious. Given risk factors for bronchogenic carcinoma, follow-up chest CT at 6 - 12 months is recommended. This recommendation follows the consensus statement: Guidelines for Management of Small Pulmonary Nodules Detected on CT Scans: A Statement from the [employer] as published in Radiology 2005;237:395-400.   Electronically Signed   By: Richardean Sale M.D.   On: 10/20/2014 20:51   US Abdomen Complete  10/22/2014   CLINICAL DATA:  History of hepatitis-C  EXAM: ULTRASOUND ABDOMEN COMPLETE  COMPARISON:  None.  FINDINGS: Gallbladder: The gallbladder is adequately distended. The gallbladder wall is top normal in thickness at just over 3.2 mm. There is no pericholecystic fluid. No stones  or sludge are observed. There is no positive sonographic Murphy's sign.  Common bile duct: Diameter: 3 mm.  Liver: The liver exhibits normal echotexture with no focal mass or ductal dilation. The surface contour is normal.  IVC: No abnormality visualized.  Pancreas: Visualized portion unremarkable.  Spleen: Size and appearance within normal limits.  Right Kidney: Length: 11.2 cm. Echogenicity within normal limits. No hydronephrosis visualized. There is a septated cyst in the lower pole of the right kidney measuring 1.2 x 0.7 x 0.8 cm.  Left Kidney: Length: 10.4 cm. Echogenicity within normal limits. No mass or hydronephrosis visualized.  Abdominal aorta: No aneurysm visualized.  Other findings: None.  IMPRESSION: 1. There is no acute hepatobiliary abnormality. There is borderline gallbladder wall thickening. If there are clinical concerns of gallbladder dysfunction, a nuclear medicine hepatobiliary scan may be useful. 2. There is Bosniak 2 septated lower pole right renal cyst measuring up to 1.2 cm in greatest dimension.   Electronically Signed   By: David  Martinique M.D.   On: 10/22/2014 09:22    EKG:   Orders placed or performed during the hospital encounter of 10/20/14  . ED EKG  . ED EKG  . EKG  12-Lead  . EKG 12-Lead      Management plans discussed with the patient, family and they are in agreement.  CODE STATUS:     Code Status Orders        Start     Ordered   10/20/14 2318  Full code   Continuous     10/20/14 2317      TOTAL TIME TAKING CARE OF THIS PATIENT: 45 minutes.    Theodoro Grist M.D on 10/22/2014 at 9:55 AM  Between 7am to 6pm - Pager - [phone]  After 6pm go to [email or web address] - password [email or web address] [facility]  [facility]  Office  [phone]  CC: Primary care physician; No primary care provider on file.

## 2014-10-22 NOTE — Progress Notes (Signed)
     To Whom It May Concern:        Mr. Stephen Waters is excuse from work through 10/29/2014. He is to resume his usual activities on 10/30/2014.       Sincerely,     Dr. Ether Griffins, MD

## 2014-10-27 ENCOUNTER — Ambulatory Visit: Payer: Self-pay

## 2015-08-24 ENCOUNTER — Emergency Department
Admission: EM | Admit: 2015-08-24 | Discharge: 2015-08-24 | Disposition: A | Discharge status: Home / Self Care | Payer: Self-pay | Attending: Emergency Medicine | Admitting: Emergency Medicine

## 2015-08-24 ENCOUNTER — Emergency Department: Payer: Self-pay

## 2015-08-24 ENCOUNTER — Encounter: Payer: Self-pay | Admitting: *Deleted

## 2015-08-24 DIAGNOSIS — F17210 Nicotine dependence, cigarettes, uncomplicated: Secondary | ICD-10-CM | POA: Insufficient documentation

## 2015-08-24 DIAGNOSIS — I319 Disease of pericardium, unspecified: Secondary | ICD-10-CM

## 2015-08-24 DIAGNOSIS — R911 Solitary pulmonary nodule: Secondary | ICD-10-CM | POA: Insufficient documentation

## 2015-08-24 DIAGNOSIS — R079 Chest pain, unspecified: Secondary | ICD-10-CM

## 2015-08-24 DIAGNOSIS — J449 Chronic obstructive pulmonary disease, unspecified: Secondary | ICD-10-CM | POA: Insufficient documentation

## 2015-08-24 DIAGNOSIS — I309 Acute pericarditis, unspecified: Secondary | ICD-10-CM | POA: Insufficient documentation

## 2015-08-24 DIAGNOSIS — Z7982 Long term (current) use of aspirin: Secondary | ICD-10-CM | POA: Insufficient documentation

## 2015-08-24 LAB — CBC WITH DIFFERENTIAL/PLATELET
Basophils Absolute: 0.1 10*3/uL (ref 0–0.1)
EOS ABS: 0.2 10*3/uL (ref 0–0.7)
Eosinophils Relative: 2 %
HCT: 45.4 % (ref 40.0–52.0)
Hemoglobin: 15.8 g/dL (ref 13.0–18.0)
Lymphocytes Relative: 11 %
Lymphs Abs: 1.2 10*3/uL (ref 1.0–3.6)
MCH: 33.7 pg (ref 26.0–34.0)
MCHC: 34.7 g/dL (ref 32.0–36.0)
MCV: 96.9 fL (ref 80.0–100.0)
MONO ABS: 0.8 10*3/uL (ref 0.2–1.0)
Monocytes Relative: 8 %
Neutro Abs: 8.8 10*3/uL — ABNORMAL HIGH (ref 1.4–6.5)
Neutrophils Relative %: 79 %
Platelets: 227 10*3/uL (ref 150–440)
RBC: 4.68 MIL/uL (ref 4.40–5.90)
RDW: 12.8 % (ref 11.5–14.5)
WBC: 11 10*3/uL — ABNORMAL HIGH (ref 3.8–10.6)

## 2015-08-24 LAB — BASIC METABOLIC PANEL
Anion gap: 5 (ref 5–15)
BUN: 10 mg/dL (ref 6–20)
CALCIUM: 9.6 mg/dL (ref 8.9–10.3)
CO2: 36 mmol/L — ABNORMAL HIGH (ref 22–32)
Chloride: 98 mmol/L — ABNORMAL LOW (ref 101–111)
Creatinine, Ser: 0.79 mg/dL (ref 0.61–1.24)
GFR calc Af Amer: >60 mL/min (ref >60)
GLUCOSE: 76 mg/dL (ref 65–99)
Potassium: 4.3 mmol/L (ref 3.5–5.1)
SODIUM: 139 mmol/L (ref 135–145)

## 2015-08-24 LAB — TROPONIN I: Troponin I: <0.03 ng/mL (ref <0.031)

## 2015-08-24 MED ORDER — IBUPROFEN 600 MG PO TABS
600.0000 mg | ORAL_TABLET | Freq: Once | ORAL | Status: AC
  Administered 2015-08-24: 600 mg via ORAL
  Filled 2015-08-24: qty 1

## 2015-08-24 MED ORDER — NITROGLYCERIN 0.4 MG SL SUBL
0.4000 mg | SUBLINGUAL_TABLET | SUBLINGUAL | Status: DC | PRN
  Administered 2015-08-24 (×3): 0.4 mg via SUBLINGUAL
  Filled 2015-08-24 (×3): qty 1

## 2015-08-24 MED ORDER — IOPAMIDOL (ISOVUE-370) INJECTION 76%
75.0000 mL | Freq: Once | INTRAVENOUS | Status: AC | PRN
  Administered 2015-08-24: 75 mL via INTRAVENOUS

## 2015-08-24 MED ORDER — IBUPROFEN 600 MG PO TABS: 600.0000 mg | ORAL_TABLET | Freq: Three times a day (TID) | ORAL | Status: DC | PRN

## 2015-08-24 MED ORDER — MORPHINE SULFATE (PF) 4 MG/ML IV SOLN: 4.0000 mg | Freq: Once | INTRAVENOUS | Status: DC

## 2015-08-24 MED ORDER — ONDANSETRON HCL 4 MG/2ML IJ SOLN
4.0000 mg | Freq: Once | INTRAMUSCULAR | Status: AC
  Administered 2015-08-24: 4 mg via INTRAVENOUS
  Filled 2015-08-24: qty 2

## 2015-08-24 MED ORDER — ASPIRIN 81 MG PO CHEW
324.0000 mg | CHEWABLE_TABLET | Freq: Once | ORAL | Status: AC
  Administered 2015-08-24: 324 mg via ORAL
  Filled 2015-08-24: qty 4

## 2015-08-24 NOTE — Discharge Instructions (Signed)
You were evaluated for chest pain, and although no certain cause was found, your exam and evaluation are reassuring in the emergency department. It's possible you may be having pericarditis, and treatment for this or anti-inflammatory ibuprofen 3 times daily as needed for pain.  We discussed, for complete cardiac evaluation, I recommend he follow with cardiology's, Dr. Fletcher Anon.  Your CT scan showed a right lower lobe lung nodule that may need a repeat chest CT in about 12 months giving your risk factor smoking. This can be ordered by primary care physician. You are referred to the [facility] clinic for primary care.  Return to emergency department immediately for any worsening condition including palpitations, trouble breathing, shortness breath, chest pain, dizziness or passing out, or any other symptoms concerning to you.   Nonspecific Chest Pain  Chest pain can be caused by many different conditions. There is always a chance that your pain could be related to something serious, such as a heart attack or a blood clot in your lungs. Chest pain can also be caused by conditions that are not life-threatening. If you have chest pain, it is very important to follow up with your health care provider. CAUSES  Chest pain can be caused by:  Heartburn.  Pneumonia or bronchitis.  Anxiety or stress.  Inflammation around your heart (pericarditis) or lung (pleuritis or pleurisy).  A blood clot in your lung.  A collapsed lung (pneumothorax). It can develop suddenly on its own (spontaneous pneumothorax) or from trauma to the chest.  Shingles infection (varicella-zoster virus).  Heart attack.  Damage to the bones, muscles, and cartilage that make up your chest wall. This can include:  Bruised bones due to injury.  Strained muscles or cartilage due to frequent or repeated coughing or overwork.  Fracture to one or more ribs.  Sore cartilage due to inflammation (costochondritis). RISK FACTORS  Risk  factors for chest pain may include:  Activities that increase your risk for trauma or injury to your chest.  Respiratory infections or conditions that cause frequent coughing.  Medical conditions or overeating that can cause heartburn.  Heart disease or family history of heart disease.  Conditions or health behaviors that increase your risk of developing a blood clot.  Having had chicken pox (varicella zoster). SIGNS AND SYMPTOMS Chest pain can feel like:  Burning or tingling on the surface of your chest or deep in your chest.  Crushing, pressure, aching, or squeezing pain.  Dull or sharp pain that is worse when you move, cough, or take a deep breath.  Pain that is also felt in your back, neck, shoulder, or arm, or pain that spreads to any of these areas. Your chest pain may come and go, or it may stay constant. DIAGNOSIS Lab tests or other studies may be needed to find the cause of your pain. Your health care provider may have you take a test called an ambulatory ECG (electrocardiogram). An ECG records your heartbeat patterns at the time the test is performed. You may also have other tests, such as:  Transthoracic echocardiogram (TTE). During echocardiography, sound waves are used to create a picture of all of the heart structures and to look at how blood flows through your heart.  Transesophageal echocardiogram (TEE).This is a more advanced imaging test that obtains images from inside your body. It allows your health care provider to see your heart in finer detail.  Cardiac monitoring. This allows your health care provider to monitor your heart rate and rhythm in real  time.  Holter monitor. This is a portable device that records your heartbeat and can help to diagnose abnormal heartbeats. It allows your health care provider to track your heart activity for several days, if needed.  Stress tests. These can be done through exercise or by taking medicine that makes your heart beat  more quickly.  Blood tests.  Imaging tests. TREATMENT  Your treatment depends on what is causing your chest pain. Treatment may include:  Medicines. These may include:  Acid blockers for heartburn.  Anti-inflammatory medicine.  Pain medicine for inflammatory conditions.  Antibiotic medicine, if an infection is present.  Medicines to dissolve blood clots.  Medicines to treat coronary artery disease.  Supportive care for conditions that do not require medicines. This may include:  Resting.  Applying heat or cold packs to injured areas.  Limiting activities until pain decreases. HOME CARE INSTRUCTIONS  If you were prescribed an antibiotic medicine, finish it all even if you start to feel better.  Avoid any activities that bring on chest pain.  Do not use any tobacco products, including cigarettes, chewing tobacco, or electronic cigarettes. If you need help quitting, ask your health care provider.  Do not drink alcohol.  Take medicines only as directed by your health care provider.  Keep all follow-up visits as directed by your health care provider. This is important. This includes any further testing if your chest pain does not go away.  If heartburn is the cause for your chest pain, you may be told to keep your head raised (elevated) while sleeping. This reduces the chance that acid will go from your stomach into your esophagus.  Make lifestyle changes as directed by your health care provider. These may include:  Getting regular exercise. Ask your health care provider to suggest some activities that are safe for you.  Eating a heart-healthy diet. A registered dietitian can help you to learn healthy eating options.  Maintaining a healthy weight.  Managing diabetes, if necessary.  Reducing stress. SEEK MEDICAL CARE IF:  Your chest pain does not go away after treatment.  You have a rash with blisters on your chest.  You have a fever. SEEK IMMEDIATE MEDICAL  CARE IF:   Your chest pain is worse.  You have an increasing cough, or you cough up blood.  You have severe abdominal pain.  You have severe weakness.  You faint.  You have chills.  You have sudden, unexplained chest discomfort.  You have sudden, unexplained discomfort in your arms, back, neck, or jaw.  You have shortness of breath at any time.  You suddenly start to sweat, or your skin gets clammy.  You feel nauseous or you vomit.  You suddenly feel light-headed or dizzy.  Your heart begins to beat quickly, or it feels like it is skipping beats. These symptoms may represent a serious problem that is an emergency. Do not wait to see if the symptoms will go away. Get medical help right away. Call your local emergency services (911 in the [place] Do not drive yourself to the hospital.   This information is not intended to replace advice given to you by your health care provider. Make sure you discuss any questions you have with your health care provider.   Document Released: 01/05/2005 Document Revised: 04/18/2014 Document Reviewed: 11/01/2013 Elsevier Interactive Patient Education 2016 Elsevier Inc.  Pulmonary Nodule  A pulmonary nodule is a small, round spot in your lung. It is usually found when pictures of your lungs are  taken for other reasons. Most pulmonary nodules are not cancerous and do not cause symptoms. Tests will be done to make sure the nodule is not cancerous. Pulmonary nodules that are not cancerous usually do not require treatment. HOME CARE   Only take medicine as told by your doctor.  Follow up with your doctor as told. GET HELP IF:  You have trouble breathing when doing activities.  You feel sick or more tired than normal.  You do not feel like eating.  You lose weight without trying to.  You have chills.  You have night sweats. GET HELP RIGHT AWAY IF:  You cannot catch your breath.  You start making whistling sounds when breathing  (wheezing).  You have a cough that does not go away.  You cough up blood.  You are dizzy or feel like you are going to pass out.  You have sudden chest pain.  You have a fever or lasting symptoms for more than 2-3 days.  You have a fever and your symptoms suddenly get worse. MAKE SURE YOU:  Understand these instructions.  Will watch your condition.  Will get help right away if you are not doing well or get worse.   This information is not intended to replace advice given to you by your health care provider. Make sure you discuss any questions you have with your health care provider.   Document Released: 04/30/2010 Document Revised: 08/12/2014 Document Reviewed: 09/17/2012 Elsevier Interactive Patient Education 2016 Elsevier Inc. Pericarditis Pericarditis is swelling (inflammation) of the pericardium. The pericardium is a thin, double-layered, fluid-filled tissue sac that surrounds the heart. The purpose of the pericardium is to contain the heart in the chest cavity and keep the heart from overexpanding. Different types of pericarditis can occur, such as:  Acute pericarditis. Inflammation can develop suddenly in acute pericarditis.  Chronic pericarditis. Inflammation develops gradually and is long-lasting in chronic pericarditis.  Constrictive pericarditis. In this type of pericarditis, the layers of the pericardium stiffen and develop scar tissue. The scar tissue thickens and sticks together. This makes it difficult for the heart to pump and work as it normally does. CAUSES  Pericarditis can be caused from different conditions, such as:  A bacterial, fungal or viral infection.  After a heart attack (myocardial infarction).  After open-heart surgery (coronary bypass graft surgery).  Auto-immune conditions such as lupus, rheumatoid arthritis or scleroderma.  Kidney failure.  Low thyroid condition (hypothyroidism).  Cancer from another part of the body that has spread  (metastasized) to the pericardium.  Chest injury or trauma.  After radiation treatment.  Certain medicines. SYMPTOMS  Symptoms of pericarditis can include:  Chest pain. Chest pain symptoms may increase when laying down and may be relieved when sitting up and leaning forward.  A chronic, dry cough.  Heart palpitations. These may feel like rapid, fluttering or pounding heart beats.  Chest pain may be worse when swallowing.  Dizziness or fainting.  Tiredness, fatigue or lethargy.  Fever. DIAGNOSIS  Pericarditis is diagnosed by the following:  A physical exam. A heart sound called a pericardial friction rub may be heard when your caregiver listens to your heart.  Blood work. Blood may be drawn to check for an infection and to look at your blood chemistry.  Electrocardiography. During electrocardiography your heart's electrical activity is monitored and recorded with a tracing on paper (electrocardiogram [ECG]).  Echocardiography.  Computed tomography (CT).  Magnetic resonance image (MRI). TREATMENT  To treat pericarditis, it is important to know  the cause of it. The cause of pericarditis determines the treatment.   If the cause of pericarditis is due to an infection, treatment is based on the type of infection. If an infection is suspected in the pericardial fluid, a procedure called a pericardial fluid culture and biopsy may be done. This takes a sample of the pericardial fluid. The sample is sent to a lab which runs tests on the pericardial fluid to check for an infection.  If the autoimmune disease is the cause, treatment of the autoimmune condition will help improve the pericarditis.  If the cause of pericarditis is not known, anti-inflammatory medicines may be used to help decrease the inflammation.  Surgery may be needed. The following are types of surgeries or procedures that may be done to treat pericarditis:  Pericardial window. A pericardial window makes a cut  (incision) into the pericardial sac. This allows excess fluid in the pericardium to drain.  Pericardiocentesis. A pericardiocentesis is also known as a pericardial tap. This procedure uses a needle that is guided by X-ray to drain (aspirate) excess fluid from the pericardium.  Pericardiectomy. A pericardiectomy removes part or all of the pericardium. HOME CARE INSTRUCTIONS   Do not smoke. If you smoke, quit. Your caregiver can help you quit smoking.  Maintain a healthy weight.  Follow an exercise program as directed by your health care provider. You may need to limit your exercising until your symptoms go away.  If you drink alcohol, do so in moderation.  Eat a heart healthy diet. A registered dietitian can help you learn about healthy food choices.  Keep a list of all your medicines with you at all times. Include the name, dose, how often it is taken and how it is taken. SEEK IMMEDIATE MEDICAL CARE IF:   You have chest pain or feelings of chest pressure.  You have sweating (diaphoresis) when at rest.  You have irregular heartbeats (palpitations).  You have rapid, racing heart beats.  You have unexplained fainting episodes.  You feel sick to your stomach (nausea) or vomiting without cause.  You have unexplained weakness. If you develop any of the symptoms which originally made you seek care, call for local emergency medical help. Do not drive yourself to the hospital.   This information is not intended to replace advice given to you by your health care provider. Make sure you discuss any questions you have with your health care provider.   Document Released: 09/21/2000 Document Revised: 08/12/2014 Document Reviewed: 10/08/2014 Elsevier Interactive Patient Education [employer].

## 2015-08-24 NOTE — ED Provider Notes (Signed)
[facility] Emergency Department Provider Note   ____________________________________________  Time seen: Approximately 7:45 AM I have reviewed the triage vital signs and the triage nursing note.  HISTORY  Chief Complaint Chest Pain   Historian Patient  HPI Stephen Waters is a 60 y.o. male with a history of COPD, no primary care physician, and a current smoker, who is here for evaluation of left-sided chest pain. He states that he wakes up often throughout the night when he woke up at 4 AM this morning he was having a sharp left-sided chest pain which has been constant but a little bit waxing and waning since 4 AM this morning. Some radiation occasionally up into the left side of the neck. No numbness or tingling. No new shortness of breath. Not pleuritic. No coughing or recent fevers.  Denies history of known coronary artery disease, but also reports no history of prior stress test or catheterization. Symptoms are moderate, with pain that is moderate to severe. Denies GERD symptoms.  He states after he woke up the pain he decided to shower then he went to the gas station to get some coffee and then he went to work and he was told to come in for evaluation.    Past Medical History  Diagnosis Date  . COPD (chronic obstructive pulmonary disease) [facility])     Patient Active Problem List   Diagnosis Date Noted  . Acute respiratory failure with hypoxia ([place]) 10/22/2014  . Acute bronchitis 10/22/2014  . Tobacco abuse 10/22/2014  . COPD exacerbation ([place]) 10/20/2014  . 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 No past surgeries      Current Outpatient Rx  Name  Route  Sig  Dispense  Refill  . albuterol (PROVENTIL) (2.5 MG/3ML) 0.083% nebulizer solution   Nebulization   Take 3 mLs (2.5 mg total) by nebulization every 4 (four) hours.   75 mL   12   . azithromycin (ZITHROMAX) 250 MG  tablet   Oral   Take 1 tablet (250 mg total) by mouth daily.   4 each   0   . budesonide-formoterol (SYMBICORT) 160-4.5 MCG/ACT inhaler   Inhalation   Inhale 2 puffs into the lungs 2 (two) times daily.   1 Inhaler   12   . nicotine (NICODERM CQ - DOSED IN MG/24 HOURS) 21 mg/24hr patch   Transdermal   Place 1 patch (21 mg total) onto the skin daily.   28 patch   0   . nicotine (NICOTROL) 10 MG inhaler   Inhalation   Inhale 1 cartridge (1 continuous puffing total) into the lungs as needed for smoking cessation.   42 each   0   . predniSONE (DELTASONE) 5 MG tablet      Label  & dispense according to the schedule below. 10 Pills PO for 3 days then, 8 Pills PO for 3 days, 6 Pills PO for 3 days, 4 Pills PO for 3 days, 2 Pills PO for 3 days, 1 Pills PO for 3 days, 1/2 Pill  PO for 3 days then STOP. Total 95 pills.   95 tablet   0     Dispense as written.   . tiotropium (SPIRIVA) 18 MCG inhalation capsule   Inhalation   Place 1 capsule (18 mcg total) into inhaler and inhale daily.   30 capsule   12     Allergies Review of patient's  allergies indicates no known allergies.  No family history on file.  Social History Social History  Substance Use Topics  . Smoking status: Current Every Day Smoker -- 2.00 packs/day for 40 years    Types: Cigarettes  . Smokeless tobacco: None  . Alcohol Use: 0.0 oz/week    0 Standard drinks or equivalent per week     Comment: 1 beer daily    Review of Systems  Constitutional: Negative for fever. Eyes: Negative for visual changes. ENT: Negative for sore throat. Cardiovascular: Positive for chest pain. Negative for palpitations Respiratory: Negative for shortness of breath. Gastrointestinal: Negative for abdominal pain, vomiting and diarrhea. Genitourinary: Negative for dysuria. Musculoskeletal: Negative for back pain. Skin: Negative for rash. Neurological: Negative for headache. 10 point Review of Systems otherwise  negative ____________________________________________   PHYSICAL EXAM:  VITAL SIGNS: ED Triage Vitals  Enc Vitals Group     BP 08/24/15 0731 133/76 mmHg     Pulse Rate 08/24/15 0731 97     Resp 08/24/15 0731 20     Temp 08/24/15 0731 97.7 F (36.5 C)     Temp Source 08/24/15 0731 Oral     SpO2 08/24/15 0731 98 %     Weight 08/24/15 0731 120 lb (54.432 kg)     Height 08/24/15 0731 5\' 10"  (1.778 m)     Head Cir --      Peak Flow --      Pain Score 08/24/15 0732 7     Pain Loc --      Pain Edu? --      Excl. in [place]? --      Constitutional: Alert and oriented. Wincing in pain, mild distress due to pain. HEENT   Head: Normocephalic and atraumatic.      Eyes: Conjunctivae are normal. PERRL. Normal extraocular movements.      Ears:         Nose: No congestion/rhinnorhea.   Mouth/Throat: Mucous membranes are moist.   Neck: No stridor. Cardiovascular/Chest: Normal rate, regular rhythm.  No murmurs, rubs, or gallops. Chest nontender to anterior and lateral compression/palpation. Respiratory: Normal respiratory effort without tachypnea nor retractions. Breath sounds are clear and equal bilaterally. No wheezes/rales/rhonchi. Gastrointestinal: Soft. No distention, no guarding, no rebound. Nontender.    Genitourinary/rectal:Deferred Musculoskeletal: Nontender with normal range of motion in all extremities. No joint effusions.  No lower extremity tenderness.  No edema. Neurologic:  Normal speech and language. No gross or focal neurologic deficits are appreciated. Skin:  Skin is warm, dry and intact. No rash noted. Psychiatric: Mood and affect are normal. Speech and behavior are normal. Patient exhibits appropriate insight and judgment.  ____________________________________________   EKG I, Lisa Roca, MD, the attending physician have personally viewed and interpreted all ECGs.  88 bpm. Underlying interference, but narrow QRS normal axis. No ST segment elevation or ST  segment depression or pathologic T-wave inversion.  Repeat EKG 81 bpm. Normal sinus rhythm. Narrow QRS. Normal axis. J-point elevation, questionable ST segment elevation diffusely ____________________________________________  LABS (pertinent positives/negatives)  Troponin less than 0.03 CBC 11.0, hemoglobin 15.8 and platelet count [id number] Basic metabolic panel without significant abnormality Repeat troponin less than 0.03  ____________________________________________  RADIOLOGY All Xrays were viewed by me. Imaging interpreted by Radiologist.  Portable one view: IMPRESSION: Lungs somewhat hyperexpanded with bibasilar scarring, stable. No edema or consolidation. Stable cardiac silhouette.  CT chest for PE:  IMPRESSION: No demonstrable pulmonary embolus. Scattered foci of coronary artery calcification evident.  Stable mildly prominent right  hilar lymph node. Suspect reactive etiology given the stability since prior study.  Several nodular opacities are noted in the lungs, largest measuring 5 x 5 mm. There is a single new 5 mm nodular opacity in the right lower lobe. No follow-up needed if patient is low-risk (and has no known or suspected primary neoplasm). Non-contrast chest CT can be considered in 12 months if patient is high-risk. This recommendation follows the consensus statement: Guidelines for Management of Incidental Pulmonary Nodules Detected on CT Images:From the Fleischner Society 2017; published online before print (10.1148/radiol.IJ:2314499). No parenchymal lung edema or consolidation. __________________________________________  PROCEDURES  Procedure(s) performed: None  Critical Care performed: None  ____________________________________________   ED COURSE / ASSESSMENT AND PLAN  Pertinent labs & imaging results that were available during my care of the patient were reviewed by me and considered in my medical decision making (see chart for  details).   Patient's here with about 4 hours of constant but waxing and waning left-sided chest pain that might be a little bit pleuritic, but he initially tells me and more constant.  He does have risk factors including smoking and age for ACS, but EKG upon arrival is reassuring. Initial troponin negative.  He's not wheezing, he does not appear to be having a CBD exacerbation. I'm not suspicious of a vascular emergency such as dissection.  On reexamination, patient states that the nitroglycerin did bring the pain down to about a 5 out of 10, and does not take morphine because he wants to go to drive home, and that now the pain is less but is more pleuritic.  I am to send a 3 hour troponin and I discussed with him risks versus benefit in terms of radiation for evaluation of PE, we chose to proceed.  Ultimately, I will refer him for outpatient cardiology evaluation for likely stress testing.   Chest CT for PE shows no emergency condition. I discussed with him the right lower lobe nodule, recommend CT in 12 months to be done with primary care physician.  Second EKG did show some possibly diffuse ST segment elevation, which I think is probably more J-point elevation, but could be consistent with peritonitis. I will recommend NSAIDs.  CONSULTATIONS: Dr. Fletcher Anon, on-call cardiology for outpatient follow-up.   Patient / Family / Caregiver informed of clinical course, medical decision-making process, and agree with plan.   I discussed return precautions, follow-up instructions, and discharged instructions with patient and/or family.   ___________________________________________   FINAL CLINICAL IMPRESSION(S) / ED DIAGNOSES   Final diagnoses:  Chest pain, unspecified chest pain type              Note: This dictation was prepared with Dragon dictation. Any transcriptional errors that result from this process are unintentional   Lisa Roca, MD 08/24/15 1423

## 2015-08-24 NOTE — ED Notes (Signed)
Pt complains of chest pain which started today at 0400, pt denies any other symptoms, pt reports pain is in central chest

## 2015-08-25 ENCOUNTER — Telehealth: Payer: Self-pay

## 2015-08-25 DIAGNOSIS — R079 Chest pain, unspecified: Secondary | ICD-10-CM

## 2015-08-25 NOTE — Telephone Encounter (Signed)
Pt sched for Stress ECHO on Monday, May 22 @ 11:30am, per staff message from Dr Fletcher Anon.  Left message for pt to call back to go over instructions prior to procedure.

## 2015-08-31 ENCOUNTER — Other Ambulatory Visit: Payer: Self-pay

## 2015-08-31 ENCOUNTER — Encounter: Payer: Self-pay | Admitting: *Deleted

## 2015-09-02 ENCOUNTER — Ambulatory Visit (INDEPENDENT_AMBULATORY_CARE_PROVIDER_SITE_OTHER): Payer: Self-pay

## 2015-09-02 DIAGNOSIS — R079 Chest pain, unspecified: Secondary | ICD-10-CM

## 2015-09-02 LAB — ECHOCARDIOGRAM STRESS TEST
CSEPEW: 6.8 METS
CSEPHR: 116 %
CSEPPHR: 187 {beats}/min
Exercise duration (min): 4 min
Exercise duration (sec): 52 s
MPHR: 160 {beats}/min
Rest HR: 85 {beats}/min

## 2015-09-11 ENCOUNTER — Encounter: Payer: Self-pay | Admitting: Cardiovascular Disease

## 2015-09-11 ENCOUNTER — Ambulatory Visit (INDEPENDENT_AMBULATORY_CARE_PROVIDER_SITE_OTHER): Payer: Self-pay | Admitting: Cardiovascular Disease

## 2015-09-11 VITALS — BP 114/74 | HR 80 | Ht 70.0 in | Wt 128.5 lb

## 2015-09-11 DIAGNOSIS — R911 Solitary pulmonary nodule: Secondary | ICD-10-CM

## 2015-09-11 DIAGNOSIS — R0602 Shortness of breath: Secondary | ICD-10-CM

## 2015-09-11 DIAGNOSIS — J449 Chronic obstructive pulmonary disease, unspecified: Secondary | ICD-10-CM

## 2015-09-11 MED ORDER — NICOTINE 10 MG IN INHA: 1.0000 | RESPIRATORY_TRACT | Status: DC | PRN

## 2015-09-11 NOTE — Progress Notes (Signed)
Cardiology Office Note   Date:  09/11/2015   ID:  Stephen Waters, DOB [birth year], MRN [id number]  PCP:  No PCP Per Patient  Cardiologist:   Kathlyn Sacramento, MD   Chief Complaint  Patient presents with  . other    F/u stess echo no compliants today. Meds reviewed verbally.      History of Present Illness: Stephen Waters is a 60 y.o. male who was referred by the emergency room at [facility] for evaluation of chest pain.  He has no previous cardiac history. He has known history of COPD with prolonged tobacco use. He was hospitalized in July 2016 with COPD exacerbation. He continues to smoke 2 packs per day. He has no family history of premature coronary artery disease although his father did have a stroke. On May 15, he woke up with severe substernal chest pain described as aching sensation which persisted for hours. Due to that, he went to the emergency room where he was found to have stable vital signs. Labs were unremarkable including negative troponin. CT of the chest showed no evidence of pulmonary embolism but did show pulmonary nodules. The patient followed up with [place] and had a stress echocardiogram which was negative for ischemia with normal ejection fraction. On the treadmill, he was severely limited by dyspnea. He reports no recurrent chest pain since then. He denies orthopnea, PND or leg edema.   Past Medical History  Diagnosis Date  . COPD (chronic obstructive pulmonary disease) [facility])     Past Surgical History  Procedure Laterality Date  . No past surgeries       Current Outpatient Prescriptions  Medication Sig Dispense Refill  . albuterol (PROVENTIL) (2.5 MG/3ML) 0.083% nebulizer solution Take 3 mLs (2.5 mg total) by nebulization every 4 (four) hours. 75 mL 12  . budesonide-formoterol (SYMBICORT) 160-4.5 MCG/ACT inhaler Inhale 2 puffs into the lungs 2 (two) times daily. 1 Inhaler 12  . nicotine (NICOTROL) 10 MG inhaler Inhale 1 cartridge (1 continuous puffing total)  into the lungs as needed for smoking cessation. 42 each 0  . tiotropium (SPIRIVA) 18 MCG inhalation capsule Place 1 capsule (18 mcg total) into inhaler and inhale daily. 30 capsule 12   No current facility-administered medications for this visit.    Allergies:   Review of patient's allergies indicates no known allergies.    Social History:  The patient  reports that he has been smoking Cigarettes.  He has a 80 pack-year smoking history. He does not have any smokeless tobacco history on file. He reports that he drinks alcohol. He reports that he does not use illicit drugs.   Family History:  The patient's Father had a stroke. No coronary artery disease.   ROS:  Please see the history of present illness.   Otherwise, review of systems are positive for none.   All other systems are reviewed and negative.    PHYSICAL EXAM: VS:  Ht 5\' 10"  (1.778 m)  Wt 128 lb 8 oz (58.287 kg)  BMI 18.44 kg/m2 , BMI Body mass index is 18.44 kg/(m^2). GEN: Well nourished, well developed, in no acute distress HEENT: normal Neck: no JVD, carotid bruits, or masses Cardiac: RRR; no murmurs, rubs, or gallops,no edema  Respiratory:  clear to auscultation bilaterally,  severely diminished breath sounds. GI: soft, nontender, nondistended, + BS MS: no deformity or atrophy Skin: warm and dry, no rash Neuro:  Strength and sensation are intact Psych: euthymic mood, full affect   EKG:  EKG is ordered today. The ekg ordered today demonstrates normal sinus rhythm with no significant ST or T wave changes.   Recent Labs: 10/20/2014: ALT 20 08/24/2015: BUN 10; Creatinine, Ser 0.79; Hemoglobin 15.8; Platelets 227; Potassium 4.3; Sodium 139    Lipid Panel No results found for: CHOL, TRIG, HDL, CHOLHDL, VLDL, LDLCALC, LDLDIRECT    Wt Readings from Last 3 Encounters:  09/11/15 128 lb 8 oz (58.287 kg)  08/24/15 120 lb (54.432 kg)  10/20/14 128 lb (58.06 kg)        ASSESSMENT AND PLAN:  1.  Atypical chest  pain: Does not seem to be cardiac in etiology. Negative stress test recently with normal ejection fraction.  2. COPD: This seems to be severe overall based on his symptoms and long exam. Recent CT scan showed some pulmonary nodules. Thus, I'm referring him to pulmonary for evaluation.   3. Tobacco use: I discussed with him the importance of smoking cessation. I provided him a prescription for a nicotine inhaler.    Disposition:   FU with me as needed   Signed,  Kathlyn Sacramento, MD  09/11/2015 10:58 AM    [place]

## 2015-09-11 NOTE — Patient Instructions (Signed)
Medication Instructions:  Your physician recommends that you continue on your current medications as directed. Please refer to the Current Medication list given to you today.   Labwork: none  Testing/Procedures: none  Follow-Up: Your physician recommends that you schedule a follow-up appointment with Dr. Fletcher Anon as needed.    Any Other Special Instructions Will Be Listed Below (If Applicable). Referral to Pulmonary     If you need a refill on your cardiac medications before your next appointment, please call your pharmacy.

## 2015-09-15 ENCOUNTER — Ambulatory Visit (INDEPENDENT_AMBULATORY_CARE_PROVIDER_SITE_OTHER): Payer: Self-pay | Admitting: Internal Medicine

## 2015-09-15 ENCOUNTER — Encounter: Payer: Self-pay | Admitting: Internal Medicine

## 2015-09-15 VITALS — BP 110/70 | HR 92 | Ht 70.0 in | Wt 127.0 lb

## 2015-09-15 DIAGNOSIS — J449 Chronic obstructive pulmonary disease, unspecified: Secondary | ICD-10-CM

## 2015-09-15 MED ORDER — ALBUTEROL SULFATE HFA 108 (90 BASE) MCG/ACT IN AERS: 2.0000 | INHALATION_SPRAY | Freq: Four times a day (QID) | RESPIRATORY_TRACT | Status: DC | PRN

## 2015-09-15 NOTE — Patient Instructions (Signed)
Chronic Obstructive Pulmonary Disease Chronic obstructive pulmonary disease (COPD) is a common lung condition in which airflow from the lungs is limited. COPD is a general term that can be used to describe many different lung problems that limit airflow, including both chronic bronchitis and emphysema. If you have COPD, your lung function will probably never return to normal, but there are measures you can take to improve lung function and make yourself feel better. CAUSES   Smoking (common).  Exposure to secondhand smoke.  Genetic problems.  Chronic inflammatory lung diseases or recurrent infections. SYMPTOMS  Shortness of breath, especially with physical activity.  Deep, persistent (chronic) cough with a large amount of thick mucus.  Wheezing.  Rapid breaths (tachypnea).  Gray or bluish discoloration (cyanosis) of the skin, especially in your fingers, toes, or lips.  Fatigue.  Weight loss.  Frequent infections or episodes when breathing symptoms become much worse (exacerbations).  Chest tightness. DIAGNOSIS Your health care provider will take a medical history and perform a physical examination to diagnose COPD. Additional tests for COPD may include:  Lung (pulmonary) function tests.  Chest X-ray.  CT scan.  Blood tests. TREATMENT  Treatment for COPD may include:  Inhaler and nebulizer medicines. These help manage the symptoms of COPD and make your breathing more comfortable.  Supplemental oxygen. Supplemental oxygen is only helpful if you have a low oxygen level in your blood.  Exercise and physical activity. These are beneficial for nearly all people with COPD.  Lung surgery or transplant.  Nutrition therapy to gain weight, if you are underweight.  Pulmonary rehabilitation. This may involve working with a team of health care providers and specialists, such as respiratory, occupational, and physical therapists. HOME CARE INSTRUCTIONS  Take all medicines  (inhaled or pills) as directed by your health care provider.  Avoid over-the-counter medicines or cough syrups that dry up your airway (such as antihistamines) and slow down the elimination of secretions unless instructed otherwise by your health care provider.  If you are a smoker, the most important thing that you can do is stop smoking. Continuing to smoke will cause further lung damage and breathing trouble. Ask your health care provider for help with quitting smoking. He or she can direct you to community resources or hospitals that provide support.  Avoid exposure to irritants such as smoke, chemicals, and fumes that aggravate your breathing.  Use oxygen therapy and pulmonary rehabilitation if directed by your health care provider. If you require home oxygen therapy, ask your health care provider whether you should purchase a pulse oximeter to measure your oxygen level at home.  Avoid contact with individuals who have a contagious illness.  Avoid extreme temperature and humidity changes.  Eat healthy foods. Eating smaller, more frequent meals and resting before meals may help you maintain your strength.  Stay active, but balance activity with periods of rest. Exercise and physical activity will help you maintain your ability to do things you want to do.  Preventing infection and hospitalization is very important when you have COPD. Make sure to receive all the vaccines your health care provider recommends, especially the pneumococcal and influenza vaccines. Ask your health care provider whether you need a pneumonia vaccine.  Learn and use relaxation techniques to manage stress.  Learn and use controlled breathing techniques as directed by your health care provider. Controlled breathing techniques include:  Pursed lip breathing. Start by breathing in (inhaling) through your nose for 1 second. Then, purse your lips as if you were   going to whistle and breathe out (exhale) through the  pursed lips for 2 seconds.  Diaphragmatic breathing. Start by putting one hand on your abdomen just above your waist. Inhale slowly through your nose. The hand on your abdomen should move out. Then purse your lips and exhale slowly. You should be able to feel the hand on your abdomen moving in as you exhale.  Learn and use controlled coughing to clear mucus from your lungs. Controlled coughing is a series of short, progressive coughs. The steps of controlled coughing are: 1. Lean your head slightly forward. 2. Breathe in deeply using diaphragmatic breathing. 3. Try to hold your breath for 3 seconds. 4. Keep your mouth slightly open while coughing twice. 5. Spit any mucus out into a tissue. 6. Rest and repeat the steps once or twice as needed. SEEK MEDICAL CARE IF:  You are coughing up more mucus than usual.  There is a change in the color or thickness of your mucus.  Your breathing is more labored than usual.  Your breathing is faster than usual. SEEK IMMEDIATE MEDICAL CARE IF:  You have shortness of breath while you are resting.  You have shortness of breath that prevents you from:  Being able to talk.  Performing your usual physical activities.  You have chest pain lasting longer than 5 minutes.  Your skin color is more cyanotic than usual.  You measure low oxygen saturations for longer than 5 minutes with a pulse oximeter. MAKE SURE YOU:  Understand these instructions.  Will watch your condition.  Will get help right away if you are not doing well or get worse.   This information is not intended to replace advice given to you by your health care provider. Make sure you discuss any questions you have with your health care provider.   Document Released: 01/05/2005 Document Revised: 04/18/2014 Document Reviewed: 11/22/2012 Elsevier Interactive Patient Education 2016 Elsevier Inc.  

## 2015-09-15 NOTE — Progress Notes (Signed)
[facility] Consultation      Date: 09/15/2015,   MRN# [id number] Stephen Waters [birth year] Code Status:  Code Status History    Date Active Date Inactive Code Status Order ID Comments User Context   10/20/2014 11:17 PM 10/22/2014  4:01 PM Full Code [id number]  Lance Coon, MD Inpatient     Hosp day:@LENGTHOFSTAYDAYS @ Referring MD: @ATDPROV @     PCP:      AdmissionWeight: 127 lb (57.607 kg)                 CurrentWeight: 127 lb (57.607 kg) Stephen Waters is a 60 y.o. old male seen in consultation for SOB     CHIEF COMPLAINT:   SOB   HISTORY OF PRESENT ILLNESS  60 yo white male 60 pack year tobacco user seen today for chronic SOB and Chronic DOE for many years  Patient states that his exercise tolerance has declined over last several months, especially going up flights of stairs, patient has occasional cough and states that he has occasional wheezing There is no associated lower ext swelling  Patient still smokes cigarettes He states that he has good appettite, has not lost any weight, however, patient is this very thin He has no signs of infection at this time  approx 1 year ago, patient states that he was admitted for low oxygen levels and was dx with pneumonia  Patient has gone to ER last month for chest pain and was dx with pericarditis, subsequently had   CT chest which showed bullous emphysema and RLL 5x56mm nodule and RT hilar LN  Patient has no family, lives with room mates and works as Development worker, community        PAST MEDICAL HISTORY   Past Medical History  Diagnosis Date  . COPD (chronic obstructive pulmonary disease) ([place])      SURGICAL HISTORY   Past Surgical History  Procedure Laterality Date  . No past surgeries       FAMILY HISTORY   Family History  Problem Relation Age of Onset  . Family history unknown: Yes     SOCIAL HISTORY   Social History  Substance Use Topics  . Smoking status: Current Every Day Smoker --  2.00 packs/day for 40 years    Types: Cigarettes  . Smokeless tobacco: None  . Alcohol Use: 0.0 oz/week    0 Standard drinks or equivalent per week     Comment: 1 beer daily     MEDICATIONS    Home Medication:  Current Outpatient Rx  Name  Route  Sig  Dispense  Refill  . albuterol (PROVENTIL) (2.5 MG/3ML) 0.083% nebulizer solution   Nebulization   Take 3 mLs (2.5 mg total) by nebulization every 4 (four) hours.   75 mL   12   . Fluticasone-Salmeterol (ADVAIR) 250-50 MCG/DOSE AEPB   Inhalation   Inhale 1 puff into the lungs 2 (two) times daily.         Marland Kitchen albuterol (PROAIR HFA) 108 (90 Base) MCG/ACT inhaler   Inhalation   Inhale 2 puffs into the lungs every 6 (six) hours as needed for wheezing or shortness of breath.           Current Medication:  Current outpatient prescriptions:  .  albuterol (PROVENTIL) (2.5 MG/3ML) 0.083% nebulizer solution, Take 3 mLs (2.5 mg total) by nebulization every 4 (four) hours., Disp: 75 mL, Rfl: 12 .  Fluticasone-Salmeterol (ADVAIR) 250-50 MCG/DOSE AEPB, Inhale 1 puff into the lungs 2 (two)  times daily., Disp: , Rfl:  .  albuterol (PROAIR HFA) 108 (90 Base) MCG/ACT inhaler, Inhale 2 puffs into the lungs every 6 (six) hours as needed for wheezing or shortness of breath., Disp: , Rfl:     ALLERGIES   Review of patient's allergies indicates no known allergies.     REVIEW OF SYSTEMS   Review of Systems  Constitutional: Positive for malaise/fatigue. Negative for fever, chills and weight loss.  HENT: Negative for congestion and hearing loss.   Eyes: Negative for blurred vision and double vision.  Respiratory: Positive for cough and shortness of breath. Negative for hemoptysis, sputum production and wheezing.   Cardiovascular: Negative for chest pain, palpitations and orthopnea.  Gastrointestinal: Negative for heartburn, nausea, vomiting, abdominal pain, diarrhea, constipation and blood in stool.  Genitourinary: Negative for dysuria and  urgency.  Musculoskeletal: Negative for myalgias and back pain.  Skin: Negative for rash.  Neurological: Negative for dizziness and headaches.  Endo/Heme/Allergies: Does not bruise/bleed easily.  Psychiatric/Behavioral: Negative for depression. The patient is not nervous/anxious.   All other systems reviewed and are negative.    VS: BP 110/70 mmHg  Pulse 92  Ht 5\' 10"  (1.778 m)  Wt 127 lb (57.607 kg)  BMI 18.22 kg/m2  SpO2 93%     PHYSICAL EXAM  Physical Exam  Constitutional: He is oriented to person, place, and time. No distress.  Thin and malnourished appearing  HENT:  Head: Normocephalic and atraumatic.  Mouth/Throat: No oropharyngeal exudate.  Eyes: EOM are normal. Pupils are equal, round, and reactive to light. No scleral icterus.  Neck: Normal range of motion. Neck supple.  Cardiovascular: Normal rate, regular rhythm and normal heart sounds.   No murmur heard. Pulmonary/Chest: No stridor. No respiratory distress. He has no wheezes. He has no rales.  Abdominal: Soft. Bowel sounds are normal.  Musculoskeletal: Normal range of motion. He exhibits no edema.  Neurological: He is alert and oriented to person, place, and time. No cranial nerve deficit.  Skin: Skin is warm. He is not diaphoretic.  Psychiatric: He has a normal mood and affect.        LABS         IMAGING    Ct Angio Chest Pe W/cm &/or Wo Cm  08/24/2015  CLINICAL DATA:  Chest pain and shortness of breath for 1 day EXAM: CT ANGIOGRAPHY CHEST WITH CONTRAST TECHNIQUE: Multidetector CT imaging of the chest was performed using the standard protocol during bolus administration of intravenous contrast. Multiplanar CT image reconstructions and MIPs were obtained to evaluate the vascular anatomy. CONTRAST:  95 mL Isovue 370 nonionic COMPARISON:  Chest CT October 20, 2014 and chest radiograph Aug 24, 2015 FINDINGS: Mediastinum/Lymph Nodes: There is no demonstrable pulmonary embolus. There is no thoracic aortic  aneurysm or dissection. The visualized great vessels appear unremarkable. Pericardium is not appreciably thickened. There are foci of coronary artery calcification at several sites. Thyroid appears unremarkable. There is a stable prominent right hilar lymph node measuring 1.6 x 1.6 cm, stable. There is no new adenopathy evident. Lungs/Pleura: There is extensive bullous disease in the apex. There is generalized centrilobular emphysematous change. There is there are several 1-2 mm nodular opacities in the superior segment of the left lower lobe, stable. There is a stable 4 mm nodular opacity in the superior segment of the left lower lobe seen on axial slice 15 series 6, stable. There is a stable 5 x 4 mm nodular opacity in the anterior segment of the right lower  lobe, likely a parenchymal lymph node. There is a 4 mm nodular opacity in the medial aspect of the superior segment right lower lobe seen on axial slice 13 series 6. There is a nodular opacity in the superior segment of the right lower lobe on axial slice 123456 series 6 measuring 5 x 5 mm, new from prior study. No larger parenchymal lung nodular opacities are identified. No edema or consolidation. Upper abdomen: In the visualized upper abdomen, there are foci of calcification in the aorta and proximal renal arteries bilaterally. Visualized upper abdominal structures otherwise appear unremarkable. Musculoskeletal: There is stable degenerative change in the mid to lower thoracic region. A slight anterior wedging of upper lumbar vertebral bodies is stable. No blastic or lytic bone lesions are evident. Review of the MIP images confirms the above findings. IMPRESSION: No demonstrable pulmonary embolus. Scattered foci of coronary artery calcification evident. Stable mildly prominent right hilar lymph node. Suspect reactive etiology given the stability since prior study. Several nodular opacities are noted in the lungs, largest measuring 5 x 5 mm. There is a single new  5 mm nodular opacity in the right lower lobe. No follow-up needed if patient is low-risk (and has no known or suspected primary neoplasm). Non-contrast chest CT can be considered in 12 months if patient is high-risk. This recommendation follows the consensus statement: Guidelines for Management of Incidental Pulmonary Nodules Detected on CT Images:From the Fleischner Society 2017; published online before print (10.1148/radiol.IJ:2314499). No parenchymal lung edema or consolidation. Electronically Signed   By: Lowella Grip III M.D.   On: 08/24/2015 11:44   Dg Chest Port 1 View  08/24/2015  CLINICAL DATA:  One day history of left-sided chest pain EXAM: PORTABLE CHEST 1 VIEW COMPARISON:  Chest radiograph October 20, 2014; chest CT February 20, 2015 FINDINGS: Lungs remain somewhat hyperexpanded. There is mild bibasilar lung scarring. There is no edema or consolidation. The heart size and pulmonary vascularity are normal. No adenopathy. No bone lesions. IMPRESSION: Lungs somewhat hyperexpanded with bibasilar scarring, stable. No edema or consolidation. Stable cardiac silhouette. Electronically Signed   By: Lowella Grip III M.D.   On: 08/24/2015 08:02     CT chest 08/24/15 images reviewed 09/15/2015    ASSESSMENT/PLAN   60 yo white male with extensive smoking history with signs and symptoms of COPD with RLL nodule and RT hilar Adenopathy  1.will need full set of pFT's and 6 MWT 2.check ONO 3.will start Anoro( LABA/AC) 4.albuterol as needed 5.smoking cessation strongly advised 6.recommend repeat CT chest in 1-2 months and will set up for EBUS/bronchoscopy after patient has inhaled therapy and stabilization of COPD  Patient is moderate/high risk for intra/post op complications   I have personally obtained a history, examined the patient, evaluated laboratory and independently reviewed imaging results, formulated the assessment and plan and placed orders.  The Patient requires high complexity  decision making for assessment and support, frequent evaluation and titration of therapies, application of advanced monitoring technologies and extensive interpretation of multiple databases.   Patient satisfied with Plan of action and management. All questions answered  Corrin Parker, M.D.  [facility]  Medical Director [place] Director [facility] Department

## 2015-10-06 ENCOUNTER — Telehealth: Payer: Self-pay | Admitting: Internal Medicine

## 2015-10-06 MED ORDER — UMECLIDINIUM-VILANTEROL 62.5-25 MCG/INH IN AEPB: 1.0000 | INHALATION_SPRAY | Freq: Every day | RESPIRATORY_TRACT | Status: DC

## 2015-10-06 NOTE — Telephone Encounter (Signed)
Spoke with Stephen Waters at the pharmacy to make sure we had the correct pharmacy. Anoro sent to pharmacy. Nothing further needed.

## 2015-10-06 NOTE — Telephone Encounter (Signed)
Pharmacist needs rx for Anoro ellipta. If sent electronically, pharmacist states to look under Medication MGMT clinic.

## 2015-10-20 ENCOUNTER — Encounter: Payer: Self-pay | Admitting: *Deleted

## 2015-10-20 ENCOUNTER — Ambulatory Visit: Payer: Self-pay

## 2015-10-30 ENCOUNTER — Ambulatory Visit: Payer: Self-pay | Admitting: Internal Medicine

## 2015-10-30 ENCOUNTER — Encounter: Payer: Self-pay | Admitting: *Deleted

## 2016-09-22 ENCOUNTER — Emergency Department
Admission: EM | Admit: 2016-09-22 | Discharge: 2016-09-22 | Disposition: A | Discharge status: Home / Self Care | Payer: Self-pay | Attending: Emergency Medicine | Admitting: Emergency Medicine

## 2016-09-22 ENCOUNTER — Encounter: Payer: Self-pay | Admitting: Emergency Medicine

## 2016-09-22 ENCOUNTER — Emergency Department: Payer: Self-pay

## 2016-09-22 DIAGNOSIS — J449 Chronic obstructive pulmonary disease, unspecified: Secondary | ICD-10-CM | POA: Insufficient documentation

## 2016-09-22 DIAGNOSIS — J441 Chronic obstructive pulmonary disease with (acute) exacerbation: Secondary | ICD-10-CM

## 2016-09-22 DIAGNOSIS — Z79899 Other long term (current) drug therapy: Secondary | ICD-10-CM | POA: Insufficient documentation

## 2016-09-22 DIAGNOSIS — R531 Weakness: Secondary | ICD-10-CM | POA: Insufficient documentation

## 2016-09-22 DIAGNOSIS — F17210 Nicotine dependence, cigarettes, uncomplicated: Secondary | ICD-10-CM | POA: Insufficient documentation

## 2016-09-22 LAB — CBC
HEMATOCRIT: 46.6 % (ref 40.0–52.0)
HEMOGLOBIN: 16.2 g/dL (ref 13.0–18.0)
MCH: 34.4 pg — AB (ref 26.0–34.0)
MCHC: 34.8 g/dL (ref 32.0–36.0)
MCV: 99 fL (ref 80.0–100.0)
Platelets: 252 10*3/uL (ref 150–440)
RBC: 4.71 MIL/uL (ref 4.40–5.90)
RDW: 12.6 % (ref 11.5–14.5)
WBC: 9.2 10*3/uL (ref 3.8–10.6)

## 2016-09-22 LAB — BASIC METABOLIC PANEL
ANION GAP: 7 (ref 5–15)
BUN: 7 mg/dL (ref 6–20)
CO2: 36 mmol/L — AB (ref 22–32)
Calcium: 9.6 mg/dL (ref 8.9–10.3)
Chloride: 95 mmol/L — ABNORMAL LOW (ref 101–111)
Creatinine, Ser: 0.67 mg/dL (ref 0.61–1.24)
GFR calc Af Amer: >60 mL/min (ref >60)
GFR calc non Af Amer: >60 mL/min (ref >60)
GLUCOSE: 107 mg/dL — AB (ref 65–99)
POTASSIUM: 4.1 mmol/L (ref 3.5–5.1)
Sodium: 138 mmol/L (ref 135–145)

## 2016-09-22 LAB — TROPONIN I: Troponin I: <0.03 ng/mL (ref <0.03)

## 2016-09-22 MED ORDER — IPRATROPIUM-ALBUTEROL 0.5-2.5 (3) MG/3ML IN SOLN
3.0000 mL | Freq: Once | RESPIRATORY_TRACT | Status: AC
  Administered 2016-09-22: 3 mL via RESPIRATORY_TRACT
  Filled 2016-09-22: qty 3

## 2016-09-22 MED ORDER — PREDNISONE 10 MG PO TABS: 10.0000 mg | ORAL_TABLET | Freq: Every day | ORAL | 0 refills | Status: DC

## 2016-09-22 MED ORDER — PREDNISONE 20 MG PO TABS
60.0000 mg | ORAL_TABLET | Freq: Once | ORAL | Status: AC
  Administered 2016-09-22: 60 mg via ORAL
  Filled 2016-09-22: qty 3

## 2016-09-22 NOTE — ED Notes (Signed)
Pt alert and oriented X4, active, cooperative, pt in NAD. RR even and unlabored, color WNL.  Pt will not stay to allow repeat VS. Pt states that he is ready to leave. Standing at doorway awaiting discharge papers.

## 2016-09-22 NOTE — Discharge Instructions (Signed)
As we discussed please stop smoking as soon as possible. Please increase caloric intake to include whey protein supplements.   Please take your steroids as prescribed for the entire course. Please follow-up with a primary care physician in the next 2-3 days for recheck/reevaluation. Return to the emergency department for any acutely concerning symptoms.

## 2016-09-22 NOTE — ED Notes (Addendum)
Pt appears very upset, states his is concerned on why is losing weight and muscle tone. Pt reports generalized fatigue for last week.   Asked patient to provide urine sample. Pt will not attempt at this time.

## 2016-09-22 NOTE — ED Provider Notes (Signed)
[facility] Emergency Department Provider Note  Time seen: 1:42 PM  I have reviewed the triage vital signs and the nursing notes.   HISTORY  Chief Complaint Weakness    HPI Stephen Waters is a 61 y.o. male with a past medical history of COPD who presents to the emergency department with a cold. According to the patient for the past 2 or 3 days he has had nasal congestion and shortness of breath. He was at work today, some as work told him to go home early and go to the ER for evaluation so he came here. As a secondary complaint he states for years now he has felt weak and unsteady when walking, feels like his muscles are wasting away. Denies any chest pain. Denies any headache confusion and slurred speech or focal weakness/numbness.  Past Medical History:  Diagnosis Date  . COPD (chronic obstructive pulmonary disease) [facility])     Patient Active Problem List   Diagnosis Date Noted  . Acute respiratory failure with hypoxia ([place]) 10/22/2014  . Acute bronchitis 10/22/2014  . Tobacco abuse 10/22/2014  . COPD exacerbation ([place]) 10/20/2014  . Carrier or suspected carrier of hepatitis C 10/20/2014    Past Surgical History:  Procedure Laterality Date  . NO PAST SURGERIES      Prior to Admission medications   Medication Sig Start Date End Date Taking? Authorizing Provider  albuterol (PROAIR HFA) 108 (90 Base) MCG/ACT inhaler Inhale 2 puffs into the lungs every 6 (six) hours as needed for wheezing or shortness of breath.    [provider]  albuterol (PROVENTIL HFA;VENTOLIN HFA) 108 (90 Base) MCG/ACT inhaler Inhale 2 puffs into the lungs every 6 (six) hours as needed for wheezing or shortness of breath. 09/15/15   Flora Lipps, MD  albuterol (PROVENTIL) (2.5 MG/3ML) 0.083% nebulizer solution Take 3 mLs (2.5 mg total) by nebulization every 4 (four) hours. 10/22/14   Theodoro Grist, MD  Fluticasone-Salmeterol (ADVAIR) 250-50 MCG/DOSE AEPB Inhale 1 puff into the  lungs 2 (two) times daily.    [provider]  umeclidinium-vilanterol (ANORO ELLIPTA) 62.5-25 MCG/INH AEPB Inhale 1 puff into the lungs daily. 10/06/15   Flora Lipps, MD    No Known Allergies  Family History  Problem Relation Age of Onset  . Family history unknown: Yes    Social History Social History  Substance Use Topics  . Smoking status: Current Every Day Smoker    Packs/day: 2.00    Years: 40.00    Types: Cigarettes  . Smokeless tobacco: Never Used  . Alcohol use 0.0 oz/week     Comment: 1 beer daily    Review of Systems Constitutional: Negative for fever. Cardiovascular: Negative for chest pain. Respiratory: Negative for shortness of breath. Gastrointestinal: Negative for abdominal pain Genitourinary: Negative for dysuria. Musculoskeletal: Negative for back pain. Skin: Negative for rash. Neurological: Negative for headache All other ROS negative  ____________________________________________   PHYSICAL EXAM:  VITAL SIGNS: ED Triage Vitals [09/22/16 0951]  Enc Vitals Group     BP 130/81     Pulse Rate (!) 111     Resp 20     Temp 97.9 F (36.6 C)     Temp Source Oral     SpO2 91 %     Weight 125 lb (56.7 kg)     Height 5\' 10"  (1.778 m)     Head Circumference      Peak Flow      Pain Score  Pain Loc      Pain Edu?      Excl. in [place]?     Constitutional: Alert and oriented. Well appearing and in no distress.Mildly anxious. Eyes: Normal exam ENT   Head: Normocephalic and atraumatic.   Mouth/Throat: Mucous membranes are moist. Cardiovascular: Normal rate, regular rhythm. No murmur Respiratory: Normal respiratory effort with decreased breath sounds bilaterally. No obvious wheeze. Gastrointestinal: Soft and nontender. No distention. Musculoskeletal: Nontender with normal range of motion in all extremities. Thin extremities. Neurologic:  Normal speech and language. No gross focal neurologic deficits  Skin:  Skin is warm, dry and  intact.  Psychiatric: Mood and affect are normal.  ____________________________________________    EKG  EKG reviewed and interpreted by myself shows sinus tachycardia 106 bpm, narrow QRS, normal axis, normal intervals, nonspecific ST changes without ST elevation.  ____________________________________________    RADIOLOGY  Chest x-ray shows COPD otherwise negative  ____________________________________________   INITIAL IMPRESSION / ASSESSMENT AND PLAN / ED COURSE  Pertinent labs & imaging results that were available during my care of the patient were reviewed by me and considered in my medical decision making (see chart for details).  Patient presents in the emergency department for nasal congestion and states some shortness of breath. Patient has a history of COPD he is a current every day smoker. Currently satting 92-93% on room air. Patient does have decreased respiratory sounds bilaterally but no obvious wheeze. We will treat with DuoNeb nebs and prednisone in the emergency department. As a secondary complaint the patient is also complaining of various complaints such as generalized fatigue/weakness, muscle wasting and a 5 pound weight loss over the past 6 months. I discussed with the patient the need to stop smoking as I believe a lot of his symptoms are COPD related. I also discussed diet and exercising. Overall the patient appears well, he does appear mildly anxious. His workup is largely normal including negative troponin. Most consistent with COPD. We will discharge with prednisone.  ____________________________________________   FINAL CLINICAL IMPRESSION(S) / ED DIAGNOSES  COPD Weakness    Harvest Dark, MD 09/22/16 1349

## 2016-09-22 NOTE — ED Triage Notes (Signed)
Pt c/o for past 2 days has been very weak and tired.  Reports had trouble getting out of bathtub today.  Has had SHOB as well and reports with exertion, especially stairs it is worse.  Appears anxious in triage.  NAD.

## 2016-09-23 ENCOUNTER — Inpatient Hospital Stay
Admission: EM | Admit: 2016-09-23 | Discharge: 2016-09-25 | DRG: 190 | Disposition: A | Discharge status: Home Health | Payer: Self-pay | Attending: Specialist | Admitting: Specialist

## 2016-09-23 ENCOUNTER — Other Ambulatory Visit: Payer: Self-pay

## 2016-09-23 ENCOUNTER — Inpatient Hospital Stay: Payer: Self-pay

## 2016-09-23 DIAGNOSIS — B182 Chronic viral hepatitis C: Secondary | ICD-10-CM | POA: Diagnosis present

## 2016-09-23 DIAGNOSIS — Z79899 Other long term (current) drug therapy: Secondary | ICD-10-CM

## 2016-09-23 DIAGNOSIS — R64 Cachexia: Secondary | ICD-10-CM | POA: Diagnosis present

## 2016-09-23 DIAGNOSIS — G931 Anoxic brain damage, not elsewhere classified: Secondary | ICD-10-CM | POA: Diagnosis present

## 2016-09-23 DIAGNOSIS — Z716 Tobacco abuse counseling: Secondary | ICD-10-CM

## 2016-09-23 DIAGNOSIS — R590 Localized enlarged lymph nodes: Secondary | ICD-10-CM | POA: Diagnosis present

## 2016-09-23 DIAGNOSIS — J9621 Acute and chronic respiratory failure with hypoxia: Secondary | ICD-10-CM | POA: Diagnosis present

## 2016-09-23 DIAGNOSIS — Z681 Body mass index (BMI) 19 or less, adult: Secondary | ICD-10-CM

## 2016-09-23 DIAGNOSIS — F17210 Nicotine dependence, cigarettes, uncomplicated: Secondary | ICD-10-CM | POA: Diagnosis present

## 2016-09-23 DIAGNOSIS — J9600 Acute respiratory failure, unspecified whether with hypoxia or hypercapnia: Secondary | ICD-10-CM

## 2016-09-23 DIAGNOSIS — R0602 Shortness of breath: Secondary | ICD-10-CM

## 2016-09-23 DIAGNOSIS — F419 Anxiety disorder, unspecified: Secondary | ICD-10-CM | POA: Diagnosis present

## 2016-09-23 DIAGNOSIS — F1410 Cocaine abuse, uncomplicated: Secondary | ICD-10-CM | POA: Diagnosis present

## 2016-09-23 DIAGNOSIS — R0902 Hypoxemia: Secondary | ICD-10-CM

## 2016-09-23 DIAGNOSIS — J9622 Acute and chronic respiratory failure with hypercapnia: Secondary | ICD-10-CM | POA: Diagnosis not present

## 2016-09-23 DIAGNOSIS — J441 Chronic obstructive pulmonary disease with (acute) exacerbation: Principal | ICD-10-CM | POA: Diagnosis present

## 2016-09-23 DIAGNOSIS — R0600 Dyspnea, unspecified: Secondary | ICD-10-CM

## 2016-09-23 DIAGNOSIS — E440 Moderate protein-calorie malnutrition: Secondary | ICD-10-CM | POA: Diagnosis present

## 2016-09-23 HISTORY — DX: Chronic viral hepatitis C: B18.2

## 2016-09-23 HISTORY — DX: Nicotine dependence, unspecified, uncomplicated: F17.200

## 2016-09-23 LAB — URINE DRUG SCREEN, QUALITATIVE (ARMC ONLY)
AMPHETAMINES, UR SCREEN: NOT DETECTED
Barbiturates, Ur Screen: NOT DETECTED
Benzodiazepine, Ur Scrn: NOT DETECTED
COCAINE METABOLITE, UR ~~LOC~~: POSITIVE — AB
Cannabinoid 50 Ng, Ur ~~LOC~~: NOT DETECTED
MDMA (ECSTASY) UR SCREEN: NOT DETECTED
METHADONE SCREEN, URINE: NOT DETECTED
OPIATE, UR SCREEN: POSITIVE — AB
PHENCYCLIDINE (PCP) UR S: NOT DETECTED
Tricyclic, Ur Screen: NOT DETECTED

## 2016-09-23 LAB — TROPONIN I: Troponin I: <0.03 ng/mL (ref <0.03)

## 2016-09-23 LAB — BLOOD GAS, ARTERIAL
ACID-BASE EXCESS: 8.8 mmol/L — AB (ref 0.0–2.0)
Allens test (pass/fail): POSITIVE — AB
BICARBONATE: 37.3 mmol/L — AB (ref 20.0–28.0)
FIO2: 40
O2 Saturation: 89.5 %
PCO2 ART: 66 mmHg — AB (ref 32.0–48.0)
PH ART: 7.36 (ref 7.350–7.450)
PO2 ART: 60 mmHg — AB (ref 83.0–108.0)
Patient temperature: 37

## 2016-09-23 LAB — COMPREHENSIVE METABOLIC PANEL
ALBUMIN: 4.4 g/dL (ref 3.5–5.0)
ALK PHOS: 79 U/L (ref 38–126)
ALT: 17 U/L (ref 17–63)
AST: 20 U/L (ref 15–41)
Anion gap: 9 (ref 5–15)
BILIRUBIN TOTAL: 0.5 mg/dL (ref 0.3–1.2)
BUN: 10 mg/dL (ref 6–20)
CALCIUM: 10.2 mg/dL (ref 8.9–10.3)
CO2: 32 mmol/L (ref 22–32)
CREATININE: 0.64 mg/dL (ref 0.61–1.24)
Chloride: 98 mmol/L — ABNORMAL LOW (ref 101–111)
GFR calc non Af Amer: >60 mL/min (ref >60)
GLUCOSE: 110 mg/dL — AB (ref 65–99)
Potassium: 4.4 mmol/L (ref 3.5–5.1)
SODIUM: 139 mmol/L (ref 135–145)
TOTAL PROTEIN: 8.3 g/dL — AB (ref 6.5–8.1)

## 2016-09-23 LAB — CBC
HEMATOCRIT: 45.9 % (ref 40.0–52.0)
HEMOGLOBIN: 16 g/dL (ref 13.0–18.0)
MCH: 34.4 pg — AB (ref 26.0–34.0)
MCHC: 35 g/dL (ref 32.0–36.0)
MCV: 98.3 fL (ref 80.0–100.0)
Platelets: 257 10*3/uL (ref 150–440)
RBC: 4.67 MIL/uL (ref 4.40–5.90)
RDW: 12.5 % (ref 11.5–14.5)
WBC: 9.7 10*3/uL (ref 3.8–10.6)

## 2016-09-23 LAB — GLUCOSE, CAPILLARY: Glucose-Capillary: 95 mg/dL (ref 65–99)

## 2016-09-23 LAB — MRSA PCR SCREENING: MRSA by PCR: NEGATIVE

## 2016-09-23 LAB — ETHANOL: Alcohol, Ethyl (B): <5 mg/dL (ref <5)

## 2016-09-23 MED ORDER — POLYETHYLENE GLYCOL 3350 17 G PO PACK: 17.0000 g | PACK | Freq: Every day | ORAL | Status: DC | PRN

## 2016-09-23 MED ORDER — IPRATROPIUM-ALBUTEROL 0.5-2.5 (3) MG/3ML IN SOLN
3.0000 mL | Freq: Once | RESPIRATORY_TRACT | Status: AC
  Administered 2016-09-23: 3 mL via RESPIRATORY_TRACT
  Filled 2016-09-23: qty 3

## 2016-09-23 MED ORDER — SODIUM CHLORIDE 0.9% FLUSH
3.0000 mL | Freq: Two times a day (BID) | INTRAVENOUS | Status: DC
  Administered 2016-09-23 – 2016-09-25 (×5): 3 mL via INTRAVENOUS

## 2016-09-23 MED ORDER — LEVOFLOXACIN 750 MG PO TABS
750.0000 mg | ORAL_TABLET | Freq: Every day | ORAL | Status: DC
  Administered 2016-09-24 – 2016-09-25 (×2): 750 mg via ORAL
  Filled 2016-09-23: qty 1
  Filled 2016-09-23: qty 2

## 2016-09-23 MED ORDER — METHYLPREDNISOLONE SODIUM SUCC 125 MG IJ SOLR
60.0000 mg | Freq: Two times a day (BID) | INTRAMUSCULAR | Status: DC
  Administered 2016-09-23 – 2016-09-25 (×4): 60 mg via INTRAVENOUS
  Filled 2016-09-23 (×4): qty 2

## 2016-09-23 MED ORDER — ALBUTEROL SULFATE (2.5 MG/3ML) 0.083% IN NEBU
2.5000 mg | INHALATION_SOLUTION | RESPIRATORY_TRACT | Status: DC | PRN
  Filled 2016-09-23: qty 3

## 2016-09-23 MED ORDER — CHLORHEXIDINE GLUCONATE 0.12 % MT SOLN: 15.0000 mL | Freq: Two times a day (BID) | OROMUCOSAL | Status: DC

## 2016-09-23 MED ORDER — METHYLPREDNISOLONE SODIUM SUCC 125 MG IJ SOLR
125.0000 mg | Freq: Once | INTRAMUSCULAR | Status: AC
  Administered 2016-09-23: 125 mg via INTRAVENOUS
  Filled 2016-09-23: qty 2

## 2016-09-23 MED ORDER — IOPAMIDOL (ISOVUE-370) INJECTION 76%
75.0000 mL | Freq: Once | INTRAVENOUS | Status: AC | PRN
  Administered 2016-09-23: 75 mL via INTRAVENOUS

## 2016-09-23 MED ORDER — ENOXAPARIN SODIUM 40 MG/0.4ML ~~LOC~~ SOLN
40.0000 mg | SUBCUTANEOUS | Status: DC
  Administered 2016-09-23 – 2016-09-24 (×2): 40 mg via SUBCUTANEOUS
  Filled 2016-09-23 (×2): qty 0.4

## 2016-09-23 MED ORDER — LEVOFLOXACIN IN D5W 750 MG/150ML IV SOLN
750.0000 mg | Freq: Once | INTRAVENOUS | Status: AC
  Administered 2016-09-23: 750 mg via INTRAVENOUS
  Filled 2016-09-23: qty 150

## 2016-09-23 MED ORDER — SODIUM CHLORIDE 0.9 % IV BOLUS (SEPSIS)
500.0000 mL | Freq: Once | INTRAVENOUS | Status: AC
  Administered 2016-09-23: 500 mL via INTRAVENOUS

## 2016-09-23 MED ORDER — ORAL CARE MOUTH RINSE
15.0000 mL | Freq: Two times a day (BID) | OROMUCOSAL | Status: DC
  Administered 2016-09-24 (×2): 15 mL via OROMUCOSAL

## 2016-09-23 MED ORDER — LORAZEPAM 2 MG/ML IJ SOLN
1.0000 mg | Freq: Once | INTRAMUSCULAR | Status: AC
  Administered 2016-09-23: 1 mg via INTRAVENOUS
  Filled 2016-09-23: qty 1

## 2016-09-23 MED ORDER — IPRATROPIUM-ALBUTEROL 0.5-2.5 (3) MG/3ML IN SOLN
3.0000 mL | RESPIRATORY_TRACT | Status: DC
  Administered 2016-09-23 – 2016-09-24 (×5): 3 mL via RESPIRATORY_TRACT
  Filled 2016-09-23 (×5): qty 3

## 2016-09-23 MED ORDER — BUDESONIDE 0.25 MG/2ML IN SUSP
0.2500 mg | Freq: Two times a day (BID) | RESPIRATORY_TRACT | Status: DC
Start: 2016-09-23 — End: 2016-09-24
  Administered 2016-09-24: 0.25 mg via RESPIRATORY_TRACT
  Filled 2016-09-23: qty 2

## 2016-09-23 MED ORDER — ACETAMINOPHEN 650 MG RE SUPP: 650.0000 mg | Freq: Four times a day (QID) | RECTAL | Status: DC | PRN

## 2016-09-23 MED ORDER — ONDANSETRON HCL 4 MG PO TABS: 4.0000 mg | ORAL_TABLET | Freq: Four times a day (QID) | ORAL | Status: DC | PRN

## 2016-09-23 MED ORDER — PNEUMOCOCCAL VAC POLYVALENT 25 MCG/0.5ML IJ INJ
0.5000 mL | INJECTION | INTRAMUSCULAR | Status: AC
  Administered 2016-09-24: 0.5 mL via INTRAMUSCULAR
  Filled 2016-09-23: qty 0.5

## 2016-09-23 MED ORDER — ORAL CARE MOUTH RINSE
15.0000 mL | Freq: Two times a day (BID) | OROMUCOSAL | Status: DC
  Administered 2016-09-23: 15 mL via OROMUCOSAL

## 2016-09-23 MED ORDER — ACETAMINOPHEN 325 MG PO TABS: 650.0000 mg | ORAL_TABLET | Freq: Four times a day (QID) | ORAL | Status: DC | PRN

## 2016-09-23 MED ORDER — ONDANSETRON HCL 4 MG/2ML IJ SOLN: 4.0000 mg | Freq: Four times a day (QID) | INTRAMUSCULAR | Status: DC | PRN

## 2016-09-23 MED ORDER — TAMSULOSIN HCL 0.4 MG PO CAPS
0.4000 mg | ORAL_CAPSULE | Freq: Every day | ORAL | Status: DC
  Administered 2016-09-23 – 2016-09-24 (×2): 0.4 mg via ORAL
  Filled 2016-09-23 (×2): qty 1

## 2016-09-23 NOTE — Progress Notes (Signed)
Spoke with NP Hinton Dyer re patient retaingin urine, she will review, may order flomax

## 2016-09-23 NOTE — Care Management (Addendum)
RNCM consult received and will follow. Attempted to reach patient's emergency contact/patient's brother at number listed but it has been disconnected. Patient appears to be from home where he has been independent based on ED notes however he has become weak with reported weight loss. He presented to [facility] ED yesterday and discharged back to home on Prednisone that he apparently filled and took at least one while consuming alcohol per provider note- his ETOH screen was not significant. No other family listed on patient's chart. RN will attempt to gather family contact. Patient's cell phone (stored in patient's closet ICU 7 with tennis shoes and soiled blue jeans) had 4-5 numbers listed however none were family. He had a missed call from [phone] on his cell but when I attempted to reach them it went to a [employer].  Contact named Berenice Primas states "she met him at a picnic and he keeps calling me but I don't know who he is- he goes by [place] and drives a mo-ped"- [phone].  Man named Carolan Clines [phone] could not recall this cell phone number. Patient's health care/situation was kept confidential when speaking to contacts from patient's cell. RNCM requested toxicology screen.  PT consult would be helpful with discharge planning when appropriate- patient currently in ICU on BiPAP. Patient is listed at being uninsured.

## 2016-09-23 NOTE — Progress Notes (Signed)
Pt now on 4 liters [place], O2 sats mid nineties, he is more alert and is oriented, accompanied him to CT Scan, pt continues to decline to urinate?  Will bladder scan him

## 2016-09-23 NOTE — ED Notes (Addendum)
Pt now lying in stretcher, eyes shut and appears to be more calm at this time.   Pt has dry, nonproductive hacking cough.

## 2016-09-23 NOTE — ED Provider Notes (Signed)
[facility] Emergency Department Provider Note  Time seen: 9:48 AM  I have reviewed the triage vital signs and the nursing notes.   HISTORY  Chief Complaint Shortness of Breath    HPI Stephen Waters is a 61 y.o. male with a past medical history of COPD, who presents to the emergency department for medical evaluation. Per EMS they were called out to the patient's residence for evaluation. They state the patient was agitated, would not allow EMS to start an IV or perform much of an evaluation. When asked why he called EMS he states he doesn't know. When asked if anything is bothering him he states he doesn't know. When asked if he is having chest pain he states he doesn't know. Patient is extremely anxious appearing during my evaluation. We are able to speak with him and calm him down somewhat. He is able to answer questions for [place]. He was seen by myself yesterday diagnosed with a COPD exacerbation was discharged on prednisone. Patient did fill the prednisone and took his first dose of pills last night. He also admits drinking alcohol last night. He is now calm enough to state that he is not having chest pain, he does feel short of breath, his main concern is feeling weak.  Past Medical History:  Diagnosis Date  . COPD (chronic obstructive pulmonary disease) [facility])     Patient Active Problem List   Diagnosis Date Noted  . Acute respiratory failure with hypoxia ([place]) 10/22/2014  . Acute bronchitis 10/22/2014  . Tobacco abuse 10/22/2014  . COPD exacerbation ([place]) 10/20/2014  . Carrier or suspected carrier of hepatitis C 10/20/2014    Past Surgical History:  Procedure Laterality Date  . NO PAST SURGERIES      Prior to Admission medications   Medication Sig Start Date End Date Taking? Authorizing Provider  albuterol (PROAIR HFA) 108 (90 Base) MCG/ACT inhaler Inhale 2 puffs into the lungs every 6 (six) hours as needed for wheezing or shortness of breath.     [provider]  albuterol (PROVENTIL HFA;VENTOLIN HFA) 108 (90 Base) MCG/ACT inhaler Inhale 2 puffs into the lungs every 6 (six) hours as needed for wheezing or shortness of breath. 09/15/15   Flora Lipps, MD  albuterol (PROVENTIL) (2.5 MG/3ML) 0.083% nebulizer solution Take 3 mLs (2.5 mg total) by nebulization every 4 (four) hours. 10/22/14   Theodoro Grist, MD  Fluticasone-Salmeterol (ADVAIR) 250-50 MCG/DOSE AEPB Inhale 1 puff into the lungs 2 (two) times daily.    [provider]  predniSONE (DELTASONE) 10 MG tablet Take 1 tablet (10 mg total) by mouth daily. Day 1-3: take 4 tablets PO daily Day 4-6: take 3 tablets PO daily Day 7-9: take 2 tablets PO daily Day 10-12: take 1 tablet PO daily 09/22/16   Harvest Dark, MD  umeclidinium-vilanterol (ANORO ELLIPTA) 62.5-25 MCG/INH AEPB Inhale 1 puff into the lungs daily. 10/06/15   Flora Lipps, MD    No Known Allergies  Family History  Problem Relation Age of Onset  . Family history unknown: Yes    Social History Social History  Substance Use Topics  . Smoking status: Current Every Day Smoker    Packs/day: 2.00    Years: 40.00    Types: Cigarettes  . Smokeless tobacco: Never Used  . Alcohol use 0.0 oz/week     Comment: 1 beer daily    Review of Systems Constitutional: Negative for fever.Generalized weakness. ENT: Negative for congestion Cardiovascular: Negative for chest pain. Respiratory: Negative  for shortness of breath. Gastrointestinal: Negative for abdominal pain Musculoskeletal: Negative for back pain. Neurological: Negative for headache All other ROS negative  ____________________________________________   PHYSICAL EXAM:  VITAL SIGNS: ED Triage Vitals  Enc Vitals Group     BP 09/23/16 0948 (!) 152/105     Pulse Rate 09/23/16 0948 (!) 110     Resp 09/23/16 0948 (!) 26     Temp 09/23/16 0948 98.1 F (36.7 C)     Temp Source 09/23/16 0948 Oral     SpO2 09/23/16 0948 93 %     Weight 09/23/16  0945 125 lb (56.7 kg)     Height 09/23/16 0945 5\' 10"  (1.778 m)     Head Circumference --      Peak Flow --      Pain Score --      Pain Loc --      Pain Edu? --      Excl. in [place]? --     Constitutional: Alert and oriented. Well appearing and in no distress. Eyes: Normal exam ENT   Head: Normocephalic and atraumatic.   Mouth/Throat: Mucous membranes are moist. Cardiovascular: Normal rate, regular rhythm. No murmur Respiratory: Moderate tachypnea with decrease breath sounds bilaterally. No obvious wheeze. Gastrointestinal: Soft and nontender. No distention. Musculoskeletal: Nontender with normal range of motion in all extremities.  Neurologic:  Normal speech and language. No gross focal neurologic deficits Skin:  Skin is warm, dry and intact.  Psychiatric: Very anxious appearing. Easily agitated.  ____________________________________________    EKG  EKG reviewed and interpreted by myself shows a low to go interference, does appear to be a sinus rhythm at 102 bpm, narrow QRS, normal axis, largely normal intervals with nonspecific ST changes. No obvious ST elevation.  ____________________________________________  INITIAL IMPRESSION / ASSESSMENT AND PLAN / ED COURSE  Pertinent labs & imaging results that were available during my care of the patient were reviewed by me and considered in my medical decision making (see chart for details).  Patient presents the emergency department with complaints of generalized weakness, appears to be very anxious with mild respiratory distress. States he cannot sit down he needs to stand up. Patient satting 94% on room air currently. We will check labs, chest x-ray from yesterday showed COPD without pneumonia. We will treat with Ativan, dose Solu-Medrol and continue to closely monitor.  The patient continues to be extremely anxious despite 1 mg of Ativan. He is sitting up in bed spitting on the floor. He now has a room air saturation of 83%  with a great waveform. Remains awake alert oriented, still very anxious. Given the patient's worsening COPD despite breathing treatments & Medrol. Patient will be admitted to the hospital. ____________________________________________   FINAL CLINICAL IMPRESSION(S) / ED DIAGNOSES  COPD exacerbation Anxiety Hypoxia    Harvest Dark, MD 09/23/16 1052

## 2016-09-23 NOTE — ED Notes (Signed)
RT at bedside.

## 2016-09-23 NOTE — H&P (Signed)
[place] [facility] [place] NAME: Stephen Waters    MR#:  [id number]  DATE OF BIRTH:  July 01, 1955  DATE OF ADMISSION:  09/23/2016  PRIMARY CARE PHYSICIAN: Patient, No Pcp Per   REQUESTING/REFERRING PHYSICIAN: Dr. Kerman Passey  CHIEF COMPLAINT:   Chief Complaint  Patient presents with  . Shortness of Breath    HISTORY OF PRESENT ILLNESS:  Stephen Waters  is a 61 y.o. male with a known history of COPD, tobacco use, hep C carrier presents to the hospital with worsening shortness of breath and wheezing. Patient was seen yesterday in the emergency room with similar complaints and was discharged home with prednisone. Patient continued to worsen and today saturations were 83%. On arrival patient was wheezing and tachypneic extremely anxious. Was given a dose of Ativan. This improved his respiratory rate but patient has slowly gotten more drowsy. At this time is unable to give me history. Wakes up on calling his name or 2 word answers and falls asleep. Chest x-ray checked yesterday showed no pneumonia or pulmonary edema. Afebrile.  PAST MEDICAL HISTORY:   Past Medical History:  Diagnosis Date  . COPD (chronic obstructive pulmonary disease) ([place])   . Hepatitis C carrier ([place])   . Tobacco use disorder     PAST SURGICAL HISTORY:   Past Surgical History:  Procedure Laterality Date  . NO PAST SURGERIES      SOCIAL HISTORY:   Social History  Substance Use Topics  . Smoking status: Current Every Day Smoker    Packs/day: 2.00    Years: 40.00    Types: Cigarettes  . Smokeless tobacco: Never Used  . Alcohol use 0.0 oz/week     Comment: 1 beer daily    FAMILY HISTORY:   Family History  Problem Relation Age of Onset  . Leukemia Mother   . Stroke Father     DRUG ALLERGIES:  No Known Allergies  REVIEW OF SYSTEMS:   Review of Systems  Unable to perform ROS: Mental status change    MEDICATIONS AT HOME:   Prior to Admission medications    Medication Sig Start Date End Date Taking? Authorizing Provider  predniSONE (DELTASONE) 10 MG tablet Take 1 tablet (10 mg total) by mouth daily. Day 1-3: take 4 tablets PO daily Day 4-6: take 3 tablets PO daily Day 7-9: take 2 tablets PO daily Day 10-12: take 1 tablet PO daily 09/22/16  Yes Harvest Dark, MD  albuterol (PROVENTIL HFA;VENTOLIN HFA) 108 (90 Base) MCG/ACT inhaler Inhale 2 puffs into the lungs every 6 (six) hours as needed for wheezing or shortness of breath. Patient not taking: Reported on 09/23/2016 09/15/15   Flora Lipps, MD  albuterol (PROVENTIL) (2.5 MG/3ML) 0.083% nebulizer solution Take 3 mLs (2.5 mg total) by nebulization every 4 (four) hours. Patient not taking: Reported on 09/23/2016 10/22/14   Theodoro Grist, MD  umeclidinium-vilanterol [facility] ELLIPTA) 62.5-25 MCG/INH AEPB Inhale 1 puff into the lungs daily. Patient not taking: Reported on 09/23/2016 10/06/15   Flora Lipps, MD     VITAL SIGNS:  Blood pressure 98/82, pulse (!) 101, temperature 98.1 F (36.7 C), temperature source Oral, resp. rate (!) 30, height 5\' 10"  (1.778 m), weight 56.7 kg (125 lb), SpO2 91 %.  PHYSICAL EXAMINATION:  Physical Exam  GENERAL:  61 y.o.-year-old patient lying in the bed with Respiratory distress EYES: Pupils equal, round, reactive to light and accommodation. No scleral icterus. Extraocular muscles intact.  HEENT: Head atraumatic, normocephalic. Oropharynx and nasopharynx  clear. No oropharyngeal erythema, moist oral mucosa  NECK:  Supple, no jugular venous distention. No thyroid enlargement, no tenderness.  LUNGS: Decreased air entry bilaterally with wheezing CARDIOVASCULAR: S1, S2 normal. No murmurs, rubs, or gallops.  ABDOMEN: Soft, nontender, nondistended. Bowel sounds present. No organomegaly or mass.  EXTREMITIES: No pedal edema, cyanosis, or clubbing. + 2 pedal & radial pulses b/l.   NEUROLOGIC: Moves all 4 extremities PSYCHIATRIC: The patient is Drowsy. SKIN: No obvious  rash, lesion, or ulcer.   LABORATORY PANEL:   CBC  Recent Labs Lab 09/23/16 0945  WBC 9.7  HGB 16.0  HCT 45.9  PLT 257   ------------------------------------------------------------------------------------------------------------------  Chemistries   Recent Labs Lab 09/23/16 0945  NA 139  K 4.4  CL 98*  CO2 32  GLUCOSE 110*  BUN 10  CREATININE 0.64  CALCIUM 10.2  AST 20  ALT 17  ALKPHOS 79  BILITOT 0.5   ------------------------------------------------------------------------------------------------------------------  Cardiac Enzymes  Recent Labs Lab 09/23/16 0945  TROPONINI <0.03   ------------------------------------------------------------------------------------------------------------------  RADIOLOGY:  Dg Chest 2 View  Result Date: 09/22/2016 CLINICAL DATA:  Shortness of breath EXAM: CHEST  2 VIEW COMPARISON:  08/24/2015 FINDINGS: Hyperinflation with emphysematous and bronchitic markings. There is no edema, consolidation, effusion, or pneumothorax. No acute osseous finding. Normal heart size and mediastinal contours. Remote compression fracture at T12 or L1. IMPRESSION: COPD without acute superimposed finding. Electronically Signed   By: Monte Fantasia M.D.   On: 09/22/2016 10:18     IMPRESSION AND PLAN:   * Acute COPD exacerbation with acute hypoxic resp failure Start BiPAP as patient is drowsy and falls asleep very easily. It is unclear if this is due to the Ativan he received earlier or bleeding up of CO2. Will admit to stepdown area. Check an ABG. -IV steroids, Antibiotics - Scheduled Nebulizers - Inhalers -Wean Bipap as tolerated - Consult pulmonary  * Acute encephalopathy likely due to hypercarbia or Ativan  * DVT prophylaxis with Lovenox  All the records are reviewed and case discussed with ED provider. Management plans discussed with the patient, family and they are in agreement.  CODE STATUS: FULL CODE  TOTAL CC TIME TAKING CARE  OF THIS PATIENT: 40 minutes.   Hillary Bow R M.D on 09/23/2016 at 11:48 AM  Between 7am to 6pm - Pager - [phone]  After 6pm go to [email or web address] - password [email or web address] [facility]  Office  [phone]  CC: Primary care physician; Patient, No Pcp Per  Note: This dictation was prepared with Dragon dictation along with smaller phrase technology. Any transcriptional errors that result from this process are unintentional.

## 2016-09-23 NOTE — ED Notes (Addendum)
Pt oxygen now 83% on RA while lying down. Pt placed on 4L [place], 90% at this time. EDP at bedside to assess.

## 2016-09-23 NOTE — ED Notes (Signed)
Pt taken to ICU by this RN and RT. All of belongings including cell phone went with patient.

## 2016-09-23 NOTE — Progress Notes (Signed)
Pharmacy Antibiotic Note  Stephen Waters is a 61 y.o. male admitted on 09/23/2016 with COPD exacerbation.  Pharmacy has been consulted for levofloxaicn dosing.  Plan: Patient received levofloxacin 750mg  IV in the ED. Will continue patient on levofloxacin 750mg  PO Q24hr with next scheduled dose on 6/16. Will treat for a total of 5 days.   Height: 5\' 10"  (177.8 cm) Weight: 125 lb (56.7 kg) IBW/kg (Calculated) : 73  Temp (24hrs), Avg:98.1 F (36.7 C), Min:98.1 F (36.7 C), Max:98.1 F (36.7 C)   Recent Labs Lab 09/22/16 0950 09/23/16 0945  WBC 9.2 9.7  CREATININE 0.67 0.64    Estimated Creatinine Clearance: 77.8 mL/min (by C-G formula based on SCr of 0.64 mg/dL).    No Known Allergies  Antimicrobials this admission: Levofloxacin 6/15 >>    Dose adjustments this admission: N/A  Microbiology results: None ordered  Thank you for allowing pharmacy to be a part of this patient's care.  Bora Bost L 09/23/2016 12:17 PM

## 2016-09-23 NOTE — ED Notes (Signed)
Report to Cheryl, RN

## 2016-09-23 NOTE — Consult Note (Signed)
[name]      Assessment  AECOPD Severe emphysema with continued smoking.  Lung nodules and hilar lymphadenopathy.  Acute on chronic hypercapnic respiratory failure with somnolence.   -Steroids, bronchodilators.  -CT chest to r/o and evaluate lung nodules/lymphadenopathy.  -Smoking cessation.  -Bipap qhs and prn.     Date: 09/23/2016  MRN# [id number] Stephen Waters December 29, 1955  Referring Physician: Dr. Darvin Neighbours for dyspnea.   Stephen Waters is a 61 y.o. old male seen in consultation for chief complaint of:    Chief Complaint  Patient presents with  . Shortness of Breath    HPI:  Stephen Waters  is a 61 y.o. male smoker with a known history of COPD, tobacco use, hep C carrier presents to the hospital with worsening shortness of breath and wheezing. He has a known history bullous emphysema, RLL nodule, last seen in the office 09/15/15. At that time he was recommended smoking cessation, [place], repeat CT chest in 1-2 months with need for possible biopsy.   He presented to the ED yesterday with dyspnea, progressive weakness and fatigue, progressive loss of strength. He was discharged with steroid. Today the patient was found by EMS agitated on his porch, there were several alcohol bottles. His sat was 83% on RA.  He had an ABG done in ED which showed 7.36/66/60/37.3-- compensated hypercapnia.   PMHX:   Past Medical History:  Diagnosis Date  . COPD (chronic obstructive pulmonary disease) ([place])   . Hepatitis C carrier ([place])   . Tobacco use disorder    Surgical Hx:  Past Surgical History:  Procedure Laterality Date  . NO PAST SURGERIES     Family Hx:  Family History  Problem Relation Age of Onset  . Leukemia Mother   . Stroke Father    Social Hx:   Social History  Substance Use Topics  . Smoking status: Current Every Day Smoker    Packs/day: 2.00    Years: 40.00    Types: Cigarettes  . Smokeless tobacco: Never Used  . Alcohol  use 0.0 oz/week     Comment: 1 beer daily   Medication:    Current Facility-Administered Medications:  .  acetaminophen (TYLENOL) tablet 650 mg, 650 mg, Oral, Q6H PRN **OR** acetaminophen (TYLENOL) suppository 650 mg, 650 mg, Rectal, Q6H PRN, Sudini, Srikar, MD .  albuterol (PROVENTIL) (2.5 MG/3ML) 0.083% nebulizer solution 2.5 mg, 2.5 mg, Nebulization, Q2H PRN, Sudini, Srikar, MD .  chlorhexidine (PERIDEX) 0.12 % solution 15 mL, 15 mL, Mouth Rinse, BID, Sudini, Srikar, MD .  enoxaparin (LOVENOX) injection 40 mg, 40 mg, Subcutaneous, Q24H, Sudini, Srikar, MD .  ipratropium-albuterol (DUONEB) 0.5-2.5 (3) MG/3ML nebulizer solution 3 mL, 3 mL, Nebulization, Q4H, Sudini, Srikar, MD .  Derrill Memo ON 09/24/2016] levofloxacin (LEVAQUIN) tablet 750 mg, 750 mg, Oral, Daily, Sudini, Srikar, MD .  MEDLINE mouth rinse, 15 mL, Mouth Rinse, q12n4p, Sudini, Srikar, MD, 15 mL at 09/23/16 1326 .  methylPREDNISolone sodium succinate (SOLU-MEDROL) 125 mg/2 mL injection 60 mg, 60 mg, Intravenous, Q12H, Sudini, Srikar, MD .  ondansetron (ZOFRAN) tablet 4 mg, 4 mg, Oral, Q6H PRN **OR** ondansetron (ZOFRAN) injection 4 mg, 4 mg, Intravenous, Q6H PRN, Sudini, Srikar, MD .  polyethylene glycol (MIRALAX / GLYCOLAX) packet 17 g, 17 g, Oral, Daily PRN, Sudini, Srikar, MD .  sodium chloride flush (NS) 0.9 % injection 3 mL, 3 mL, Intravenous, Q12H, Sudini, Srikar, MD, 3 mL at 09/23/16 1314   Allergies:  Patient has no known allergies.  Review of Systems: Gen:  Denies  fever, sweats, chills HEENT: Denies blurred vision, double vision. bleeds, sore throat Cvc:  No dizziness, chest pain. Resp:   Denies cough or sputum production. Gi: Denies swallowing difficulty, stomach pain. Gu:  Denies bladder incontinence, burning urine Ext:   No Joint pain, stiffness. Skin: No skin rash,  hives  Endoc:  No polyuria, polydipsia. Psych: No depression, insomnia. Other:  All other systems were reviewed with the patient and were negative  other that what is mentioned in the HPI.   Physical Examination:   VS: BP 110/76   Pulse 86   Temp 98.4 F (36.9 C) (Oral)   Resp 14   Ht 5\' 10"  (1.778 m)   Wt 56.6 kg (124 lb 12.5 oz)   SpO2 94%   BMI 17.90 kg/m   General Appearance: No distress  Neuro:without focal findings,  speech normal,  HEENT: PERRLA, EOM intact.   Pulmonary: Decreased air entry bilaterally.  CardiovascularNormal S1,S2.  No m/r/g.   Abdomen: Benign, Soft, non-tender. Renal:  No costovertebral tenderness  GU:  No performed at this time. Endoc: No evident thyromegaly, no signs of acromegaly. Skin:   warm, no rashes, no ecchymosis  Extremities: normal, no cyanosis, clubbing.  Other findings:    LABORATORY PANEL:   CBC  Recent Labs Lab 09/23/16 0945  WBC 9.7  HGB 16.0  HCT 45.9  PLT 257   ------------------------------------------------------------------------------------------------------------------  Chemistries   Recent Labs Lab 09/23/16 0945  NA 139  K 4.4  CL 98*  CO2 32  GLUCOSE 110*  BUN 10  CREATININE 0.64  CALCIUM 10.2  AST 20  ALT 17  ALKPHOS 79  BILITOT 0.5   ------------------------------------------------------------------------------------------------------------------  Cardiac Enzymes  Recent Labs Lab 09/23/16 0945  TROPONINI <0.03   ------------------------------------------------------------  RADIOLOGY:  Dg Chest 2 View  Result Date: 09/22/2016 CLINICAL DATA:  Shortness of breath EXAM: CHEST  2 VIEW COMPARISON:  08/24/2015 FINDINGS: Hyperinflation with emphysematous and bronchitic markings. There is no edema, consolidation, effusion, or pneumothorax. No acute osseous finding. Normal heart size and mediastinal contours. Remote compression fracture at T12 or L1. IMPRESSION: COPD without acute superimposed finding. Electronically Signed   By: Monte Fantasia M.D.   On: 09/22/2016 10:18       Thank  you for the consultation and for allowing [facility] to assist in the care of your patient. Our recommendations are noted above.  Please contact us if we can be of further service.   Marda Stalker, MD.  Board Certified in Internal Medicine, Pulmonary Medicine, [place], and Sleep Medicine.  [facility] Office Number: [phone]  Patricia Pesa, M.D.  Merton Border, M.D  09/23/2016

## 2016-09-23 NOTE — ED Notes (Signed)
Pt sleeping. 

## 2016-09-23 NOTE — ED Notes (Signed)
Attempt to call report X 1 unsuccessful.  

## 2016-09-23 NOTE — ED Notes (Addendum)
Pt states that he lives alone, rents an apartment and has a job. Pt clothing is very dirty and pt hygiene poor.  Pt becomes very agitated when asking questions, then apologizes for being agitated. Pt denies taking any medications for agitation.

## 2016-09-23 NOTE — ED Triage Notes (Addendum)
Pt arrives to ED via EMS. Pt was found on the porch, stating that he needed help. Pt very agitated on arrival, reddened. Pt continues to state that he is loosing muscle and weight. Pt sitting forward, will not sit back in the stretcher. Pt seen in ER yesterday for weakness, discharged with prednisone prescription. Pt states that he has started taking it. Reported by EMS that multiple empty alcohol bottles found on porch. Pt denies drinking today.

## 2016-09-24 ENCOUNTER — Inpatient Hospital Stay: Payer: Self-pay

## 2016-09-24 LAB — BASIC METABOLIC PANEL
Anion gap: 8 (ref 5–15)
BUN: 13 mg/dL (ref 6–20)
CO2: 32 mmol/L (ref 22–32)
Calcium: 9.3 mg/dL (ref 8.9–10.3)
Chloride: 98 mmol/L — ABNORMAL LOW (ref 101–111)
Creatinine, Ser: 0.7 mg/dL (ref 0.61–1.24)
GFR calc Af Amer: >60 mL/min (ref >60)
GFR calc non Af Amer: >60 mL/min (ref >60)
Glucose, Bld: 142 mg/dL — ABNORMAL HIGH (ref 65–99)
Potassium: 4.4 mmol/L (ref 3.5–5.1)
Sodium: 138 mmol/L (ref 135–145)

## 2016-09-24 LAB — CBC
HEMATOCRIT: 42.9 % (ref 40.0–52.0)
HEMOGLOBIN: 14.4 g/dL (ref 13.0–18.0)
MCH: 33.4 pg (ref 26.0–34.0)
MCHC: 33.6 g/dL (ref 32.0–36.0)
MCV: 99.5 fL (ref 80.0–100.0)
Platelets: 228 10*3/uL (ref 150–440)
RBC: 4.32 MIL/uL — AB (ref 4.40–5.90)
RDW: 12.7 % (ref 11.5–14.5)
WBC: 7.8 10*3/uL (ref 3.8–10.6)

## 2016-09-24 LAB — HIV ANTIBODY (ROUTINE TESTING W REFLEX): HIV SCREEN 4TH GENERATION: NONREACTIVE

## 2016-09-24 LAB — PHOSPHORUS: Phosphorus: 3.5 mg/dL (ref 2.5–4.6)

## 2016-09-24 LAB — MAGNESIUM: MAGNESIUM: 1.9 mg/dL (ref 1.7–2.4)

## 2016-09-24 MED ORDER — ALBUTEROL SULFATE HFA 108 (90 BASE) MCG/ACT IN AERS: 2.0000 | INHALATION_SPRAY | Freq: Four times a day (QID) | RESPIRATORY_TRACT | 2 refills | Status: DC | PRN

## 2016-09-24 MED ORDER — ENSURE ENLIVE PO LIQD
237.0000 mL | Freq: Three times a day (TID) | ORAL | Status: DC
Start: 2016-09-24 — End: 2016-09-25
  Administered 2016-09-24 – 2016-09-25 (×4): 237 mL via ORAL

## 2016-09-24 MED ORDER — UMECLIDINIUM-VILANTEROL 62.5-25 MCG/INH IN AEPB: 1.0000 | INHALATION_SPRAY | Freq: Every day | RESPIRATORY_TRACT | 2 refills | Status: DC

## 2016-09-24 MED ORDER — LEVOFLOXACIN 750 MG PO TABS: 750.0000 mg | ORAL_TABLET | Freq: Every day | ORAL | 0 refills | Status: DC

## 2016-09-24 MED ORDER — IPRATROPIUM-ALBUTEROL 0.5-2.5 (3) MG/3ML IN SOLN
3.0000 mL | Freq: Four times a day (QID) | RESPIRATORY_TRACT | Status: DC
  Administered 2016-09-24 – 2016-09-25 (×3): 3 mL via RESPIRATORY_TRACT
  Filled 2016-09-24 (×3): qty 3

## 2016-09-24 MED ORDER — PREDNISONE 10 MG PO TABS: ORAL_TABLET | ORAL | 0 refills | Status: DC

## 2016-09-24 MED ORDER — BUDESONIDE 0.25 MG/2ML IN SUSP
0.2500 mg | Freq: Two times a day (BID) | RESPIRATORY_TRACT | Status: DC
  Administered 2016-09-24 – 2016-09-25 (×3): 0.25 mg via RESPIRATORY_TRACT
  Filled 2016-09-24 (×3): qty 2

## 2016-09-24 NOTE — Evaluation (Signed)
Physical Therapy Evaluation Patient Details Name: Stephen Waters MRN: [id number] DOB: [birth year] Today's Date: 09/24/2016   History of Present Illness  61 yo male with onset of respiratory failure and PNA was admitted to IC and noted + cocaine use, has old spinal fracture and malnutrition.  Pt is scheduled for DC after his PT evaluation validated need for O2.  PMHx:  COPD, hep C, tobacco abuse, SOB,   Clinical Impression  PT talked with pt about his plan to get home today, as per nsg the doctor did not plan to send him this afternoon.  He is in need of O2 for mobility, as his O2 sats dropped significantly to walk.  Pt was fairly negative about this idea with PT but PT basically stated he is not going to be able to sustain much activity without it.  Pt is expecting to get home and go to work tomorrow, which PT would not recommend.  Continue acutely if pt does not leave today, to focus on monitoring of vitals, O2 sats, and work on strength training and endurance training with pt within limits of sats and tolerance.    Follow Up Recommendations Supervision - Intermittent;Outpatient PT    Equipment Recommendations  None recommended by PT    Recommendations for Other Services       Precautions / Restrictions Precautions Precautions: Fall (telemetry) Restrictions Weight Bearing Restrictions: No      Mobility  Bed Mobility Overal bed mobility: Modified Independent                Transfers Overall transfer level: Modified independent                  Ambulation/Gait Ambulation/Gait assistance: Min guard Ambulation Distance (Feet): 150 Feet Assistive device: 1 person hand held assist Gait Pattern/deviations: Step-through pattern;Wide base of support;Trunk flexed;Decreased stride length;Shuffle Gait velocity: reduced Gait velocity interpretation: Below normal speed for age/gender General Gait Details: pt is able to walk but is slower and has low energy, low endurance  and O2 sats dropped to 84% on room air.  Pt is against idea of using O2  Stairs            Wheelchair Mobility    Modified Rankin (Stroke Patients Only)       Balance Overall balance assessment: Modified Independent                                           Pertinent Vitals/Pain Pain Assessment: No/denies pain    Home Living Family/patient expects to be discharged to:: Private residence Living Arrangements: Alone Available Help at Discharge:  (none)   Home Access: Level entry     Home Layout: One level Home Equipment: None      Prior Function Level of Independence: Independent               Hand Dominance   Dominant Hand: Right    Extremity/Trunk Assessment   Upper Extremity Assessment Upper Extremity Assessment: Overall WFL for tasks assessed    Lower Extremity Assessment Lower Extremity Assessment: Generalized weakness    Cervical / Trunk Assessment Cervical / Trunk Assessment: Kyphotic  Communication   Communication: No difficulties  Cognition Arousal/Alertness: Awake/alert Behavior During Therapy: Restless Overall Cognitive Status: No family/caregiver present to determine baseline cognitive functioning  General Comments: pt is exercising poor self awareness of need to recover and use O2 for safety      General Comments      Exercises     Assessment/Plan    PT Assessment Patient needs continued PT services  PT Problem List Decreased strength;Decreased range of motion;Decreased activity tolerance;Decreased balance;Decreased mobility;Decreased coordination;Decreased knowledge of use of DME;Decreased safety awareness;Cardiopulmonary status limiting activity       PT Treatment Interventions DME instruction;Gait training;Functional mobility training;Therapeutic activities;Therapeutic exercise;Balance training;Neuromuscular re-education;Patient/family education    PT Goals  (Current goals can be found in the Care Plan section)  Acute Rehab PT Goals Patient Stated Goal: to gain weight and get stronger PT Goal Formulation: With patient Time For Goal Achievement: 10/08/16 Potential to Achieve Goals: Good    Frequency Min 2X/week   Barriers to discharge Decreased caregiver support home alone in his home    Co-evaluation               AM-PAC PT "6 Clicks" Daily Activity  Outcome Measure Difficulty turning over in bed (including adjusting bedclothes, sheets and blankets)?: A Little Difficulty moving from lying on back to sitting on the side of the bed? : A Little Difficulty sitting down on and standing up from a chair with arms (e.g., wheelchair, bedside commode, etc,.)?: A Little Help needed moving to and from a bed to chair (including a wheelchair)?: A Little Help needed walking in hospital room?: A Little Help needed climbing 3-5 steps with a railing? : A Little 6 Click Score: 18    End of Session Equipment Utilized During Treatment: Gait belt Activity Tolerance: Patient limited by fatigue;Treatment limited secondary to medical complications (Comment) (reduced gait due to O2 sats dropping to 84% during the walk) Patient left: in bed;with call bell/phone within reach Nurse Communication: Mobility status PT Visit Diagnosis: Unsteadiness on feet (R26.81);Muscle weakness (generalized) (M62.81);Difficulty in walking, not elsewhere classified (R26.2)    Time: 0881-1031 PT Time Calculation (min) (ACUTE ONLY): 27 min   Charges:   PT Evaluation $PT Eval Moderate Complexity: 1 Procedure PT Treatments $Gait Training: 8-22 mins   PT G Codes:   PT G-Codes **NOT FOR INPATIENT CLASS** Functional Assessment Tool Used: AM-PAC 6 Clicks Basic Mobility    Ramond Dial 09/24/2016, 5:11 PM   Mee Hives, PT MS Acute Rehab Dept. Number: [place] [facility] [place]

## 2016-09-24 NOTE — Progress Notes (Signed)
Report called to [facility] on 2A, VSS on room air, no pain issues, meds, chart and belongings to go with him.  He will transfer in a wheelchair.

## 2016-09-24 NOTE — Clinical Social Work Note (Addendum)
CSW received consult that the patient is requesting STR. The patient has no insurance, and SNF is $4000-$8000 per month. CSW will discuss with the patient when able. In addition, PT recommendation is pending.  PT recommendation is for outpatient PT. CSW is signing off.  Santiago Bumpers, MSW, Latanya Presser [phone]

## 2016-09-24 NOTE — Progress Notes (Signed)
SATURATION QUALIFICATIONS: (This note is used to comply with regulatory documentation for home oxygen)  Patient Saturations on Room Air at Rest = 91%  Patient Saturations on Room Air while Ambulating = 84%  Patient Saturations on 2 Liters of oxygen while Ambulating = 94%  Please briefly explain why patient needs home oxygen:

## 2016-09-24 NOTE — Progress Notes (Signed)
[facility] [place] NAME: Stephen Waters    MR#:  [id number]  DATE OF BIRTH:  12-22-1955  SUBJECTIVE:   Patient here due to shortness of breath and noted to be in acute on chronic respiratory failure with hypercapnia. Was on BiPAP and now has been weaned off. Shortness of breath improved. Patient more concerned about his weight loss and feeling weak.  REVIEW OF SYSTEMS:    Review of Systems  Constitutional: Negative for chills and fever.  HENT: Negative for congestion and tinnitus.   Eyes: Negative for blurred vision and double vision.  Respiratory: Positive for shortness of breath. Negative for cough and wheezing.   Cardiovascular: Negative for chest pain, orthopnea and PND.  Gastrointestinal: Negative for abdominal pain, diarrhea, nausea and vomiting.  Genitourinary: Negative for dysuria and hematuria.  Neurological: Positive for weakness. Negative for dizziness, sensory change and focal weakness.  All other systems reviewed and are negative.   Nutrition: Regular Tolerating Diet: Yes Tolerating PT: Await Eval.   DRUG ALLERGIES:  No Known Allergies  VITALS:  Blood pressure 104/88, pulse 93, temperature 98.2 F (36.8 C), temperature source Oral, resp. rate 14, height 5\' 10"  (1.778 m), weight 56.8 kg (125 lb 3.5 oz), SpO2 93 %.  PHYSICAL EXAMINATION:   Physical Exam  GENERAL:  61 y.o.-year-old cachectic patient lying in bed in no acute distress.  EYES: Pupils equal, round, reactive to light and accommodation. No scleral icterus. Extraocular muscles intact.  HEENT: Head atraumatic, normocephalic. Oropharynx and nasopharynx clear.  NECK:  Supple, no jugular venous distention. No thyroid enlargement, no tenderness.  LUNGS: Normal breath sounds bilaterally, no wheezing, rales, rhonchi. No use of accessory muscles of respiration.  CARDIOVASCULAR: S1, S2 normal. No murmurs, rubs, or gallops.  ABDOMEN: Soft, nontender, nondistended.  Bowel sounds present. No organomegaly or mass.  EXTREMITIES: No cyanosis, clubbing or edema b/l.    NEUROLOGIC: Cranial nerves II through XII are intact. No focal Motor or sensory deficits b/l.  Globally weak.  PSYCHIATRIC: The patient is alert and oriented x 3.  SKIN: No obvious rash, lesion, or ulcer.    LABORATORY PANEL:   CBC  Recent Labs Lab 09/24/16 0446  WBC 7.8  HGB 14.4  HCT 42.9  PLT 228   ------------------------------------------------------------------------------------------------------------------  Chemistries   Recent Labs Lab 09/23/16 0945 09/24/16 0446  NA 139 138  K 4.4 4.4  CL 98* 98*  CO2 32 32  GLUCOSE 110* 142*  BUN 10 13  CREATININE 0.64 0.70  CALCIUM 10.2 9.3  MG  --  1.9  AST 20  --   ALT 17  --   ALKPHOS 79  --   BILITOT 0.5  --    ------------------------------------------------------------------------------------------------------------------  Cardiac Enzymes  Recent Labs Lab 09/23/16 0945  TROPONINI <0.03   ------------------------------------------------------------------------------------------------------------------  RADIOLOGY:  Ct Angio Chest Pe W Or Wo Contrast  Result Date: 09/23/2016 CLINICAL DATA:  Stephen Waters is a 61 y.o. male smoker with a known history of COPD, tobacco use, hep C carrier presents to the hospital with worsening shortness of breath and wheezing. He has a known history bullous emphysema, RLL nodule. Fatigue, weakness, progressive loss of strength. Low oxygen saturations. EXAM: CT ANGIOGRAPHY CHEST WITH CONTRAST TECHNIQUE: Multidetector CT imaging of the chest was performed using the standard protocol during bolus administration of intravenous contrast. Multiplanar CT image reconstructions and MIPs were obtained to evaluate the vascular anatomy. CONTRAST:  75 cc Isovue 370 COMPARISON:  08/24/2015 and 10/20/2014  FINDINGS: Cardiovascular: Heart size is normal. No pericardial effusion. Coronary artery  calcifications are present. Pulmonary arteries are well opacified. No acute pulmonary embolus. Bovine arch anatomy. Mediastinum/Nodes: The visualized portion of the thyroid gland has a normal appearance. Stable appearance of mildly prominent right hilar lymph node, 1.6 x 1.8 cm. Lungs/Pleura: Significant paraseptal emphysema with bullous changes particularly at the apices. Tiny, less than 5 mm pulmonary nodules are identified, smaller when compared with the prior study. Upper Abdomen: Unremarkable. Musculoskeletal: Degenerative changes are seen in thoracic spine. No suspicious lytic or blastic lesions are identified. Review of the MIP images confirms the above findings. IMPRESSION: 1. Technically adequate exam showing no acute pulmonary embolus. 2. Coronary artery disease. 3. Stable appearance of right hilar lymph node, likely reactive given its long-term stability. 4. Smaller pulmonary nodules, consistent with benign postinflammatory process. 5. Significant emphysematous changes. Electronically Signed   By: Nolon Nations M.D.   On: 09/23/2016 17:31   Dg Chest Port 1 View  Result Date: 09/24/2016 CLINICAL DATA:  Acute respiratory failure EXAM: PORTABLE CHEST 1 VIEW COMPARISON:  09/22/2016 FINDINGS: COPD with pulmonary hyperinflation. Lungs remain clear without infiltrate or effusion. Negative for heart failure or mass lesion. IMPRESSION: No active disease. Electronically Signed   By: Franchot Gallo M.D.   On: 09/24/2016 09:04     ASSESSMENT AND PLAN:   61 year old male with past medical history of COPD with ongoing tobacco abuse, polysubstance abuse, hepatitis C, who presented to the hospital due to shortness of breath.  1. Acute on chronic respiratory failure with hypoxia and hypercapnia-secondary to COPD exacerbation. -Patient was admitted and initially placed on BiPAP but his hypercapnia has improved and he is off BiPAP now. -Continue IV steroids, scheduled DuoNeb nebs, Pulmicort nebs.  Improving. - cont.  empiric Levaquin.  2. COPD exacerbation-secondary to ongoing tobacco abuse with suspected underlying pneumonia. -Continue treatment as mentioned above.  3. Polysubstance abuse-patient's urine toxicology positive for cocaine.  4. Malnutrition/pulmonary cachexia-secondary to advanced COPD. Continue nutritional supplements. Await dietary consult.  Await physical therapy evaluation and likely discharge within 1-2 days.    All the records are reviewed and case discussed with Care Management/Social Worker. Management plans discussed with the patient, family and they are in agreement.  CODE STATUS: Full code  DVT Prophylaxis: Lovenox  TOTAL TIME TAKING CARE OF THIS PATIENT: 30 minutes.   POSSIBLE D/C IN 1-2 DAYS, DEPENDING ON CLINICAL CONDITION.   Henreitta Leber M.D on 09/24/2016 at 3:29 PM  Between 7am to 6pm - Pager - [phone]  After 6pm go to [email or web address] - Proofreader  [facility]  Office  [phone]  CC: Primary care physician; Patient, No Pcp Per

## 2016-09-24 NOTE — Progress Notes (Signed)
Patient able to void later this shift. Started on Flomax. Was on bipap most of the night and tolerated. Pain free, less anxious, more irritable at times.

## 2016-09-24 NOTE — Consult Note (Signed)
[facility] Consultation      Assessment  AECOPD; doing better.  Severe emphysema with continued smoking.  Lung nodules and hilar lymphadenopathy. I personally reviewed CT chest, there is right hilar lymphadenopathy and tiny lung nodules which appear stable for > 1  Year.  Acute on chronic hypercapnic respiratory failure with somnolence.   -Wean Steroids, bronchodilators.  -Smoking cessation.  -Outpatient follow up in pulmonary clinic.     Date: 09/24/2016  MRN# [id number] Stephen Waters 29-Apr-1955  Referring Physician: Dr. Darvin Neighbours for dyspnea.   Stephen Waters is a 7 y.o. old male seen in consultation for chief complaint of:    Chief Complaint  Patient presents with  . Shortness of Breath    Subjective.  Pt is feeling better today, still c/o weight loss.   Medication:    Current Facility-Administered Medications:  .  acetaminophen (TYLENOL) tablet 650 mg, 650 mg, Oral, Q6H PRN **OR** acetaminophen (TYLENOL) suppository 650 mg, 650 mg, Rectal, Q6H PRN, Sudini, Srikar, MD .  albuterol (PROVENTIL) (2.5 MG/3ML) 0.083% nebulizer solution 2.5 mg, 2.5 mg, Nebulization, Q2H PRN, Sudini, Srikar, MD .  budesonide (PULMICORT) nebulizer solution 0.25 mg, 0.25 mg, Nebulization, Q12H, Tukov, Magadalene S, NP, 0.25 mg at 09/24/16 0806 .  enoxaparin (LOVENOX) injection 40 mg, 40 mg, Subcutaneous, Q24H, Sudini, Srikar, MD, 40 mg at 09/23/16 2035 .  feeding supplement (ENSURE ENLIVE) (ENSURE ENLIVE) liquid 237 mL, 237 mL, Oral, TID BM, Sainani, Belia Heman, MD, 237 mL at 09/24/16 1040 .  ipratropium-albuterol (DUONEB) 0.5-2.5 (3) MG/3ML nebulizer solution 3 mL, 3 mL, Nebulization, Q6H, Eulalah Rupert, MD .  levofloxacin (LEVAQUIN) tablet 750 mg, 750 mg, Oral, Daily, Sudini, Srikar, MD, 750 mg at 09/24/16 1103 .  MEDLINE mouth rinse, 15 mL, Mouth Rinse, BID, Sudini, Srikar, MD, 15 mL at 09/24/16 1041 .  methylPREDNISolone sodium succinate (SOLU-MEDROL) 125 mg/2 mL  injection 60 mg, 60 mg, Intravenous, Q12H, Sudini, Srikar, MD, 60 mg at 09/24/16 0708 .  ondansetron (ZOFRAN) tablet 4 mg, 4 mg, Oral, Q6H PRN **OR** ondansetron (ZOFRAN) injection 4 mg, 4 mg, Intravenous, Q6H PRN, Sudini, Srikar, MD .  polyethylene glycol (MIRALAX / GLYCOLAX) packet 17 g, 17 g, Oral, Daily PRN, Sudini, Srikar, MD .  sodium chloride flush (NS) 0.9 % injection 3 mL, 3 mL, Intravenous, Q12H, Sudini, Srikar, MD, 3 mL at 09/24/16 1041 .  tamsulosin (FLOMAX) capsule 0.4 mg, 0.4 mg, Oral, QPC supper, Awilda Bill, NP, 0.4 mg at 09/23/16 2015   Allergies:  Patient has no known allergies.  Review of Systems: Gen:  Denies  fever, sweats, chills HEENT: Denies blurred vision, double vision. bleeds, sore throat Cvc:  No dizziness, chest pain. Resp:   Denies cough or sputum production. Gi: Denies swallowing difficulty, stomach pain. Gu:  Denies bladder incontinence, burning urine Ext:   No Joint pain, stiffness. Skin: No skin rash,  hives  Endoc:  No polyuria, polydipsia. Psych: No depression, insomnia. Other:  All other systems were reviewed with the patient and were negative other that what is mentioned in the HPI.   Physical Examination:   VS: BP (!) 100/55   Pulse 77   Temp 97.8 F (36.6 C) (Oral)   Resp 20   Ht 5\' 10"  (1.778 m)   Wt 56.8 kg (125 lb 3.5 oz)   SpO2 96%   BMI 17.97 kg/m   General Appearance: No distress  Neuro:without focal findings,  speech normal,  HEENT: PERRLA, EOM intact.   Pulmonary: Decreased  air entry bilaterally.  CardiovascularNormal S1,S2.  No m/r/g.   Abdomen: Benign, Soft, non-tender. Renal:  No costovertebral tenderness  GU:  No performed at this time. Endoc: No evident thyromegaly, no signs of acromegaly. Skin:   warm, no rashes, no ecchymosis  Extremities: normal, no cyanosis, clubbing.  Other findings:    LABORATORY PANEL:   CBC  Recent Labs Lab 09/24/16 0446  WBC 7.8  HGB 14.4  HCT 42.9  PLT 228    ------------------------------------------------------------------------------------------------------------------  Chemistries   Recent Labs Lab 09/23/16 0945 09/24/16 0446  NA 139 138  K 4.4 4.4  CL 98* 98*  CO2 32 32  GLUCOSE 110* 142*  BUN 10 13  CREATININE 0.64 0.70  CALCIUM 10.2 9.3  MG  --  1.9  AST 20  --   ALT 17  --   ALKPHOS 79  --   BILITOT 0.5  --    ------------------------------------------------------------------------------------------------------------------  Cardiac Enzymes  Recent Labs Lab 09/23/16 0945  TROPONINI <0.03   ------------------------------------------------------------  RADIOLOGY:  Ct Angio Chest Pe W Or Wo Contrast  Result Date: 09/23/2016 CLINICAL DATA:  Stephen Waters is a 61 y.o. male smoker with a known history of COPD, tobacco use, hep C carrier presents to the hospital with worsening shortness of breath and wheezing. He has a known history bullous emphysema, RLL nodule. Fatigue, weakness, progressive loss of strength. Low oxygen saturations. EXAM: CT ANGIOGRAPHY CHEST WITH CONTRAST TECHNIQUE: Multidetector CT imaging of the chest was performed using the standard protocol during bolus administration of intravenous contrast. Multiplanar CT image reconstructions and MIPs were obtained to evaluate the vascular anatomy. CONTRAST:  75 cc Isovue 370 COMPARISON:  08/24/2015 and 10/20/2014 FINDINGS: Cardiovascular: Heart size is normal. No pericardial effusion. Coronary artery calcifications are present. Pulmonary arteries are well opacified. No acute pulmonary embolus. Bovine arch anatomy. Mediastinum/Nodes: The visualized portion of the thyroid gland has a normal appearance. Stable appearance of mildly prominent right hilar lymph node, 1.6 x 1.8 cm. Lungs/Pleura: Significant paraseptal emphysema with bullous changes particularly at the apices. Tiny, less than 5 mm pulmonary nodules are identified, smaller when compared with the prior study.  Upper Abdomen: Unremarkable. Musculoskeletal: Degenerative changes are seen in thoracic spine. No suspicious lytic or blastic lesions are identified. Review of the MIP images confirms the above findings. IMPRESSION: 1. Technically adequate exam showing no acute pulmonary embolus. 2. Coronary artery disease. 3. Stable appearance of right hilar lymph node, likely reactive given its long-term stability. 4. Smaller pulmonary nodules, consistent with benign postinflammatory process. 5. Significant emphysematous changes. Electronically Signed   By: Nolon Nations M.D.   On: 09/23/2016 17:31   Dg Chest Port 1 View  Result Date: 09/24/2016 CLINICAL DATA:  Acute respiratory failure EXAM: PORTABLE CHEST 1 VIEW COMPARISON:  09/22/2016 FINDINGS: COPD with pulmonary hyperinflation. Lungs remain clear without infiltrate or effusion. Negative for heart failure or mass lesion. IMPRESSION: No active disease. Electronically Signed   By: Franchot Gallo M.D.   On: 09/24/2016 09:04       Thank  you for the consultation and for allowing [facility] to assist in the care of your patient. Our recommendations are noted above.  Please contact us if we can be of further service.   Marda Stalker, MD.  Board Certified in Internal Medicine, Pulmonary Medicine, [place], and Sleep Medicine.  [facility] Office Number: [phone]  Patricia Pesa, M.D.  Merton Border, M.D  09/24/2016

## 2016-09-25 MED ORDER — ENSURE ENLIVE PO LIQD: 237.0000 mL | ORAL | Status: DC

## 2016-09-25 MED ORDER — FLUTICASONE FUROATE-VILANTEROL 100-25 MCG/INH IN AEPB
1.0000 | INHALATION_SPRAY | Freq: Every day | RESPIRATORY_TRACT | Status: DC
  Administered 2016-09-25: 1 via RESPIRATORY_TRACT
  Filled 2016-09-25: qty 28

## 2016-09-25 MED ORDER — FLUTICASONE FUROATE-VILANTEROL 100-25 MCG/INH IN AEPB: 1.0000 | INHALATION_SPRAY | Freq: Every day | RESPIRATORY_TRACT | 1 refills | Status: DC

## 2016-09-25 MED ORDER — ADULT MULTIVITAMIN W/MINERALS CH: 1.0000 | ORAL_TABLET | Freq: Every day | ORAL | Status: DC

## 2016-09-25 MED ORDER — ALBUTEROL SULFATE HFA 108 (90 BASE) MCG/ACT IN AERS: 2.0000 | INHALATION_SPRAY | Freq: Four times a day (QID) | RESPIRATORY_TRACT | 2 refills | Status: AC | PRN

## 2016-09-25 NOTE — Discharge Summary (Signed)
[facility] [place] NAME: Stephen Waters    MR#:  [id number]  DATE OF BIRTH:  14-May-1955  DATE OF ADMISSION:  09/23/2016 ADMITTING PHYSICIAN: Hillary Bow, MD  DATE OF DISCHARGE: 09/25/2016  1:21 PM  PRIMARY CARE PHYSICIAN: Patient, No Pcp Per    ADMISSION DIAGNOSIS:  SOB (shortness of breath) [R06.02] Hypoxia [R09.02] COPD exacerbation (HCC) [J44.1]  DISCHARGE DIAGNOSIS:  Active Problems:   COPD exacerbation ([place])   SECONDARY DIAGNOSIS:   Past Medical History:  Diagnosis Date  . COPD (chronic obstructive pulmonary disease) ([place])   . Hepatitis C carrier ([place])   . Tobacco use disorder     HOSPITAL COURSE:   61 year old male with past medical history of COPD with ongoing tobacco abuse, polysubstance abuse, hepatitis C, who presented to the hospital due to shortness of breath.  1. Acute on chronic respiratory failure with hypoxia and hypercapnia-secondary to COPD exacerbation. -Patient was admitted and initially placed on BiPAP but his hypercapnia has improved and he is off BiPAP now. -Patient was treated with IV steroids, scheduled DuoNeb's and Pulmicort nebs and has significantly improved. He was also given empiric IV antibiotics with Levaquin. He is now being discharged on oral prednisone, Levaquin and albuterol inhaler as needed along with scheduled Breo-Ellipta inhaler -Patient did qualify for home oxygen which was arranged for him prior to discharge.  2. COPD exacerbation-secondary to ongoing tobacco abuse with suspected underlying pneumonia. -Patient was treated with aggressive IV steroids, scheduled DuoNeb's, Pulmicort nebs. He was also given empiric antibiotics with Levaquin. -She has improved and now being discharged on oral prednisone taper, Levaquin and maintenance of his inhalers including albuterol inhaler,Breo-Ellipta.  He was strongly advised to quit smoking.  3. Polysubstance abuse-patient's urine toxicology  positive for cocaine. -No withdrawal while in the hospital.  4. Malnutrition/pulmonary cachexia-secondary to advanced COPD. Continue nutritional supplements.   DISCHARGE CONDITIONS:   Stable  CONSULTS OBTAINED:    DRUG ALLERGIES:  No Known Allergies  DISCHARGE MEDICATIONS:   Allergies as of 09/25/2016   No Known Allergies     Medication List    STOP taking these medications   umeclidinium-vilanterol 62.5-25 MCG/INH Aepb Commonly known as:  ANORO ELLIPTA     TAKE these medications   albuterol 108 (90 Base) MCG/ACT inhaler Commonly known as:  PROVENTIL HFA;VENTOLIN HFA Inhale 2 puffs into the lungs every 6 (six) hours as needed for wheezing or shortness of breath. What changed:  Another medication with the same name was removed. Continue taking this medication, and follow the directions you see here.   fluticasone furoate-vilanterol 100-25 MCG/INH Aepb Commonly known as:  BREO ELLIPTA Inhale 1 puff into the lungs daily.   levofloxacin 750 MG tablet Commonly known as:  LEVAQUIN Take 1 tablet (750 mg total) by mouth daily.   predniSONE 10 MG tablet Commonly known as:  DELTASONE Label  & dispense according to the schedule below. 5 Pills PO for 2 days then, 4 Pills PO for 2 days, 3 Pills PO for 2 days, 2 Pills PO for 2 days, 1 Pill PO for 2 days then STOP. What changed:  how much to take  how to take this  when to take this  additional instructions            Durable Medical Equipment        Start     Ordered   09/24/16 1640  DME Oxygen  Once    Question Answer Comment  Mode  or (Route) Nasal cannula   Liters per Minute 2   Frequency Continuous (stationary and portable oxygen unit needed)   Oxygen conserving device Yes   Oxygen delivery system Gas      09/24/16 1639        DISCHARGE INSTRUCTIONS:   DIET:  Regular diet  DISCHARGE CONDITION:  Stable  ACTIVITY:  Activity as tolerated  OXYGEN:  Home Oxygen: Yes.     Oxygen Delivery: 2  liters/min via Patient connected to nasal cannula oxygen  DISCHARGE LOCATION:  home   If you experience worsening of your admission symptoms, develop shortness of breath, life threatening emergency, suicidal or homicidal thoughts you must seek medical attention immediately by calling 911 or calling your MD immediately  if symptoms less severe.  You Must read complete instructions/literature along with all the possible adverse reactions/side effects for all the Medicines you take and that have been prescribed to you. Take any new Medicines after you have completely understood and accpet all the possible adverse reactions/side effects.   Please note  You were cared for by a hospitalist during your hospital stay. If you have any questions about your discharge medications or the care you received while you were in the hospital after you are discharged, you can call the unit and asked to speak with the hospitalist on call if the hospitalist that took care of you is not available. Once you are discharged, your primary care physician will handle any further medical issues. Please note that NO REFILLS for any discharge medications will be authorized once you are discharged, as it is imperative that you return to your primary care physician (or establish a relationship with a primary care physician if you do not have one) for your aftercare needs so that they can reassess your need for medications and monitor your lab values.     Today   Shortness of breath improved, no cough, fever, chills. No other acute events overnight.  VITAL SIGNS:  Blood pressure 112/63, pulse 80, temperature 98 F (36.7 C), resp. rate 18, height 5\' 10"  (1.778 m), weight 56.5 kg (124 lb 9 oz), SpO2 91 %.  I/O:    Intake/Output Summary (Last 24 hours) at 09/25/16 1349 Last data filed at 09/24/16 2053  Gross per 24 hour  Intake              820 ml  Output                0 ml  Net              820 ml    PHYSICAL  EXAMINATION:   GENERAL:  61 y.o.-year-old cachectic patient lying in bed in no acute distress.  EYES: Pupils equal, round, reactive to light and accommodation. No scleral icterus. Extraocular muscles intact.  HEENT: Head atraumatic, normocephalic. Oropharynx and nasopharynx clear.  NECK:  Supple, no jugular venous distention. No thyroid enlargement, no tenderness.  LUNGS: Normal breath sounds bilaterally, no wheezing, rales, rhonchi. No use of accessory muscles of respiration.  CARDIOVASCULAR: S1, S2 normal. No murmurs, rubs, or gallops.  ABDOMEN: Soft, nontender, nondistended. Bowel sounds present. No organomegaly or mass.  EXTREMITIES: No cyanosis, clubbing or edema b/l.    NEUROLOGIC: Cranial nerves II through XII are intact. No focal Motor or sensory deficits b/l.  Globally weak.  PSYCHIATRIC: The patient is alert and oriented x 3.  SKIN: No obvious rash, lesion, or ulcer.   DATA REVIEW:   CBC  Recent Labs  Lab 09/24/16 0446  WBC 7.8  HGB 14.4  HCT 42.9  PLT 228    Chemistries   Recent Labs Lab 09/23/16 0945 09/24/16 0446  NA 139 138  K 4.4 4.4  CL 98* 98*  CO2 32 32  GLUCOSE 110* 142*  BUN 10 13  CREATININE 0.64 0.70  CALCIUM 10.2 9.3  MG  --  1.9  AST 20  --   ALT 17  --   ALKPHOS 79  --   BILITOT 0.5  --     Cardiac Enzymes  Recent Labs Lab 09/23/16 0945  TROPONINI <0.03    Microbiology Results  Results for orders placed or performed during the hospital encounter of 09/23/16  MRSA PCR Screening     Status: None   Collection Time: 09/23/16  1:18 PM  Result Value Ref Range Status   MRSA by PCR NEGATIVE NEGATIVE Final    Comment:        The GeneXpert MRSA Assay (FDA approved for NASAL specimens only), is one component of a comprehensive MRSA colonization surveillance program. It is not intended to diagnose MRSA infection nor to guide or monitor treatment for MRSA infections.     RADIOLOGY:  Ct Angio Chest Pe W Or Wo Contrast  Result  Date: 09/23/2016 CLINICAL DATA:  Rondal Vandevelde is a 61 y.o. male smoker with a known history of COPD, tobacco use, hep C carrier presents to the hospital with worsening shortness of breath and wheezing. He has a known history bullous emphysema, RLL nodule. Fatigue, weakness, progressive loss of strength. Low oxygen saturations. EXAM: CT ANGIOGRAPHY CHEST WITH CONTRAST TECHNIQUE: Multidetector CT imaging of the chest was performed using the standard protocol during bolus administration of intravenous contrast. Multiplanar CT image reconstructions and MIPs were obtained to evaluate the vascular anatomy. CONTRAST:  75 cc Isovue 370 COMPARISON:  08/24/2015 and 10/20/2014 FINDINGS: Cardiovascular: Heart size is normal. No pericardial effusion. Coronary artery calcifications are present. Pulmonary arteries are well opacified. No acute pulmonary embolus. Bovine arch anatomy. Mediastinum/Nodes: The visualized portion of the thyroid gland has a normal appearance. Stable appearance of mildly prominent right hilar lymph node, 1.6 x 1.8 cm. Lungs/Pleura: Significant paraseptal emphysema with bullous changes particularly at the apices. Tiny, less than 5 mm pulmonary nodules are identified, smaller when compared with the prior study. Upper Abdomen: Unremarkable. Musculoskeletal: Degenerative changes are seen in thoracic spine. No suspicious lytic or blastic lesions are identified. Review of the MIP images confirms the above findings. IMPRESSION: 1. Technically adequate exam showing no acute pulmonary embolus. 2. Coronary artery disease. 3. Stable appearance of right hilar lymph node, likely reactive given its long-term stability. 4. Smaller pulmonary nodules, consistent with benign postinflammatory process. 5. Significant emphysematous changes. Electronically Signed   By: Nolon Nations M.D.   On: 09/23/2016 17:31   Dg Chest Port 1 View  Result Date: 09/24/2016 CLINICAL DATA:  Acute respiratory failure EXAM: PORTABLE CHEST  1 VIEW COMPARISON:  09/22/2016 FINDINGS: COPD with pulmonary hyperinflation. Lungs remain clear without infiltrate or effusion. Negative for heart failure or mass lesion. IMPRESSION: No active disease. Electronically Signed   By: Franchot Gallo M.D.   On: 09/24/2016 09:04      Management plans discussed with the patient, family and they are in agreement.  CODE STATUS:     Code Status Orders        Start     Ordered   09/23/16 1111  Full code  Continuous     09/23/16 1111  Code Status History    Date Active Date Inactive Code Status Order ID Comments User Context   10/20/2014 11:17 PM 10/22/2014  4:01 PM Full Code [id number]  Lance Coon, MD Inpatient      TOTAL TIME TAKING CARE OF THIS PATIENT: 40 minutes.    Henreitta Leber M.D on 09/25/2016 at 1:49 PM  Between 7am to 6pm - Pager - [phone]  After 6pm go to [email or web address] - Proofreader  [facility]  Office  [phone]  CC: Primary care physician; Patient, No Pcp Per

## 2016-09-25 NOTE — Care Management Note (Addendum)
Case Management Note  Patient Details  Name: Stephen Waters MRN: [id number] Date of Birth: 11-03-1955  Subjective/Objective:     Discussed discharge planning with uninsured  Stephen Waters. Obtained information for charity oxygen and home health PT. Call to Stephen Waters at [facility] requesting home health PT and New Oxygen (Portable 02 to be delivered to Stephen Waters at [facility] today and a home 02 set up to be delivered to the home address). Stephen Waters was provided with a GoodRx coupon for Levaquin to take to [facility].  Previous interventions for this COPD flare were: Albuterol Inhaler, Prednisone PO, Anoro-Ellipta inhaler, Ventolin Inhaler.              Action/Plan:   Expected Discharge Date:  09/24/16               Expected Discharge Plan:   09/25/16  In-House Referral:     Discharge planning Services     Post Acute Care Choice:   Yes Choice offered to:   Stephen Waters  DME Arranged:   New oxygen DME Agency:   [facility]  HH Arranged:   HH-PT HH Agency:   [facility]  Status of Service:   Completed.  If discussed at [place] of Stay Meetings, dates discussed:    Additional Comments:  Teshawn Waters A, RN 09/25/2016, 8:16 AM

## 2016-09-25 NOTE — Progress Notes (Signed)
Initial Nutrition Assessment  DOCUMENTATION CODES:   Severe malnutrition in context of chronic illness, Underweight  INTERVENTION:  Encouraged patient to eat regular meals during the day at home and discouraged him from skipping meals. Discussed choosing foods with adequate calories and protein as he has increased needs with COPD.   Encouraged patient to drink whole milk TID at each meal.  Recommend multivitamin with minerals daily.  Recommend Ensure Enlive po once daily, each supplement provides 350 kcal and 20 grams of protein. Patient meeting >100% of his needs here with just food. As patient likely has limited ability to purchase Ensure, recommended he try to meet his calorie and protein needs with food and milk and then use Ensure if that still is not working.  NUTRITION DIAGNOSIS:   Malnutrition (Severe) related to chronic illness (COPD, polysubstance abuse) as evidenced by severe depletion of body fat, moderate depletions of muscle mass, severe depletion of muscle mass.  GOAL:   Patient will meet greater than or equal to 90% of their needs  MONITOR:   PO intake, Supplement acceptance, Labs, Weight trends, I & O's  REASON FOR ASSESSMENT:   Malnutrition Screening Tool, Consult Assessment of nutrition requirement/status  ASSESSMENT:   61 year old male with PMHx of COPD, hepatitis C, polysubstance abuse admitted with acute exacerbation of COPD.   -Urine toxicology positive for cocaine and opiates.  Spoke with patient at bedside. Seemed uneasy and restless, so was hard to redirect at times. Unable to get a good history. Patient reports his appetite is normally okay. He reports that for breakfast he has a sausage, egg, and cheese biscuit. For lunch he may have a hamburger or hotdog. For dinner he may have a sandwich. Reports sometimes he only has one meal. He reports he works in [employer]. Patient is very concerned about weight loss and loss of muscle mass. He would like to  gain weight and muscle. He is requesting a "pill" or "something to buy at the health store" that can help him gain weight. This RD tried to explain that patient can gain weight by eating adequate calories and protein at meals, snacks, and through beverages. Patient became very distracted during discussion and began using his phone to search Google for "ways to gain weight."   Patient reports he weighed 150 lbs when he was a teenager. Reports he has been losing weight but not able to provide time frame. Per chart patient was 128.5 lbs on 09/11/2015 and has lost approximately 4 lbs (3% body weight) over the past year, which is not significant for time frame.  Meal Completion: 100% In the past 24 hrs patient has had approximately 2677 kcal (>100% estimated kcal needs) and 93 grams of protein (100% estimated protein needs) just from food alone (not including supplements ordered for pt).  Medications reviewed and include: methylprednisolone 60 mg Q12hrs.  Labs reviewed: Chloride 98, Glucose 142.  Nutrition-Focused physical exam completed. Findings are severe fat depletion, moderate-severe muscle depletion, and no edema. Poor dentition. Fingernails very dirty.  Patient meets criteria for severe chronic malnutrition (related to COPD) in setting of severe fat and muscle depletion. Likely also a strong social/environmental component contributing to malnutrition in setting of polysubstance abuse. Patient likely has limited access to food as he is eating very well here (meeting >100% needs).  Discussed with RN.  Diet Order:  Diet regular Room service appropriate? Yes; Fluid consistency: Thin Diet general  Skin:  Reviewed, no issues  Last BM:  09/24/2016 - type  3  Height:   Ht Readings from Last 1 Encounters:  09/23/16 5\' 10"  (1.778 m)    Weight:   Wt Readings from Last 1 Encounters:  09/24/16 124 lb 9 oz (56.5 kg)    Ideal Body Weight:  75.5 kg  BMI:  Body mass index is 17.87  kg/m.  Estimated Nutritional Needs:   Kcal:  1800-2070 (MSJ x 1.3-1.5)  Protein:  85-95 grams (1.5-1.7 grams/kg)  Fluid:  1.7-2 L/day (30-35 ml/kg)  EDUCATION NEEDS:   Education needs addressed  Willey Blade, MS, RD, LDN Pager: [phone] After Hours Pager: [phone]'

## 2016-09-25 NOTE — Progress Notes (Signed)
Discharge instructions explained to pt/ verbalized an understanding./ iv and tele removed/ RX given to pt ( along with coupons for RX provided by [facility]) o2 tank delivered to room/will transport off unit when ride arrives

## 2016-09-25 NOTE — Progress Notes (Signed)
Pt. Slept well throughout the night. No signs or c/o pain, SOB or acute distress observed.

## 2016-09-26 ENCOUNTER — Other Ambulatory Visit: Payer: Self-pay | Admitting: *Deleted

## 2016-09-26 DIAGNOSIS — J449 Chronic obstructive pulmonary disease, unspecified: Secondary | ICD-10-CM

## 2016-10-27 ENCOUNTER — Ambulatory Visit: Payer: Self-pay | Attending: Internal Medicine

## 2016-10-27 DIAGNOSIS — J449 Chronic obstructive pulmonary disease, unspecified: Secondary | ICD-10-CM | POA: Insufficient documentation

## 2016-10-27 MED ORDER — ALBUTEROL SULFATE (2.5 MG/3ML) 0.083% IN NEBU
2.5000 mg | INHALATION_SOLUTION | Freq: Once | RESPIRATORY_TRACT | Status: AC
  Administered 2016-10-27: 2.5 mg via RESPIRATORY_TRACT
  Filled 2016-10-27: qty 3

## 2016-10-31 ENCOUNTER — Inpatient Hospital Stay: Payer: Self-pay | Admitting: Pulmonary Disease

## 2016-11-03 ENCOUNTER — Ambulatory Visit (INDEPENDENT_AMBULATORY_CARE_PROVIDER_SITE_OTHER): Payer: Self-pay | Admitting: Internal Medicine

## 2016-11-03 ENCOUNTER — Encounter: Payer: Self-pay | Admitting: Internal Medicine

## 2016-11-03 VITALS — BP 136/90 | HR 105 | Resp 16 | Ht 70.0 in | Wt 126.0 lb

## 2016-11-03 DIAGNOSIS — J449 Chronic obstructive pulmonary disease, unspecified: Secondary | ICD-10-CM

## 2016-11-03 MED ORDER — UMECLIDINIUM-VILANTEROL 62.5-25 MCG/INH IN AEPB: 1.0000 | INHALATION_SPRAY | Freq: Every day | RESPIRATORY_TRACT | 5 refills | Status: DC

## 2016-11-03 NOTE — Addendum Note (Signed)
Addended by: Devona Konig on: 11/03/2016 04:37 PM   Modules accepted: Orders

## 2016-11-03 NOTE — Patient Instructions (Signed)
Albuterol as needed Continue an oral as prescribed Check Stephen Waters and e check 6 minute walk

## 2016-11-03 NOTE — Progress Notes (Signed)
[facility] Consultation      Date: 11/03/2016,   MRN# [id number] Stephen Waters 1956/02/07 Code Status:  Code Status History    Date Active Date Inactive Code Status Order ID Comments User Context   10/20/2014 11:17 PM 10/22/2014  4:01 PM Full Code [id number]  Lance Coon, MD Inpatient        Previous History 61 yo white male 60 pack year tobacco user seen today for chronic SOB and Chronic DOE for many years Patient still smokes cigarettes He states that he has good appettite, has not lost any weight, however, patient is this very thin approx 1 year ago, patient states that he was admitted for low oxygen levels and was dx with pneumonia Patient has gone to ER last month for chest pain and was dx with pericarditis, subsequently had  CT chest which showed bullous emphysema and RLL 5x106mm nodule and RT hilar LN Patient has no family, lives with room mates and works as Development worker, community    CHIEF COMPLAINT:   SOB   Stephen Waters   Patient was admitted for COPD exacerbation last month obtained a CT of the chest which did not show any acute findings and the hilar adenopathy was stable Patient continues to smoke Patient is chronically short of breath PFTs reviewed with patient patient has severe obstructive pulmonary disease  Patient has no signs of infection at this time Patient has no signs of congestive heart failure at this time No evidence of COPD exacerbation at this time     MEDICATIONS    Home Medication:  Current Outpatient Rx  . Order #: [id number] Class: Print  . Order #: [id number] Class: Print  . Order #: [id number] Class: Print  . Order #: [id number] Class: Print    Current Medication:  Current Outpatient Prescriptions:  .  albuterol (PROVENTIL HFA;VENTOLIN HFA) 108 (90 Base) MCG/ACT inhaler, Inhale 2 puffs into the lungs every 6 (six) hours as needed for wheezing or shortness of breath., Disp: 1 Inhaler, Rfl: 2 .  fluticasone  furoate-vilanterol (BREO ELLIPTA) 100-25 MCG/INH AEPB, Inhale 1 puff into the lungs daily., Disp: 30 each, Rfl: 1 .  levofloxacin (LEVAQUIN) 750 MG tablet, Take 1 tablet (750 mg total) by mouth daily., Disp: 5 tablet, Rfl: 0 .  predniSONE (DELTASONE) 10 MG tablet, Label  & dispense according to the schedule below. 5 Pills PO for 2 days then, 4 Pills PO for 2 days, 3 Pills PO for 2 days, 2 Pills PO for 2 days, 1 Pill PO for 2 days then STOP., Disp: 30 tablet, Rfl: 0    ALLERGIES   Patient has no known allergies.     REVIEW OF SYSTEMS   Review of Systems  Constitutional: Positive for malaise/fatigue. Negative for chills, fever and weight loss.  HENT: Negative for congestion and hearing loss.   Eyes: Negative for blurred vision and double vision.  Respiratory: Positive for cough, shortness of breath and wheezing. Negative for hemoptysis and sputum production.   Cardiovascular: Negative for chest pain, palpitations and orthopnea.  Gastrointestinal: Negative for abdominal pain, heartburn, nausea and vomiting.  Skin: Negative for rash.  All other systems reviewed and are negative.   BP 136/90 (BP Location: Left Arm, Cuff Size: Normal)   Pulse (!) 105   Resp 16   Ht 5\' 10"  (1.778 m)   Wt 126 lb (57.2 kg)   SpO2 92%   BMI 18.08 kg/m    PHYSICAL EXAM  Physical Exam  Constitutional: He is oriented  to person, place, and time. No distress.  Thin and malnourished appearing  HENT:  Mouth/Throat: No oropharyngeal exudate.  Eyes: No scleral icterus.  Cardiovascular: Normal rate, regular rhythm and normal heart sounds.   No murmur heard. Pulmonary/Chest: Effort normal and breath sounds normal. No stridor. No respiratory distress. He has no wheezes. He has no rales.  Musculoskeletal: Normal range of motion. He exhibits no edema.  Neurological: He is alert and oriented to person, place, and time. No cranial nerve deficit.  Skin: Skin is warm. He is not diaphoretic.  Psychiatric: He has  a normal mood and affect.     PFTs on October 27, 2016 shows a ratio of 33% FEV1 is 1.1 L which is 33% predicted FEF 2575 was 0.3 L which is 9% predicted Findings to suggest very severe obstructive lung disease       IMAGING      CT chest 08/24/15 images reviewed Right hilar lymph node enlarged small bilateral pulmonary nodules  CT chest 09/23/2016 Right hilar lymph node has been stable Small scattered bilateral subcentimeter nodules likely benign  Pulmonary function testing on 10/27/2016 shows a ratio of 33% predicted with FEV1 of 1.1 L with FEV1 33% predicted FEF 2575 was 0.3 L which is 9% predicted Patient has an abnormal diffusion capacity Overall these are consistent findings of very severe obstructive pulmonary disease  ASSESSMENT/PLAN   61 yo white male with extensive smoking history with signs and symptoms of COPD with RLL nodule and RT hilar Adenopathy-Right hilar and right lower lobe nodule are stable over the last couple of months  CT of the chest and pulmonary function testing reviewed with patient   #1 shortness of breath and dyspnea on exertion related to severe COPD along with malnourished state Will need to evaluate for hypoxia with 6 minute walk and overnight pulse oximetry  #2 severe COPD Gold stage D Continue neural as prescribed Albuterol as needed  #3 smoking cessation strongly advised Patient does not want to stop smoking at this time  #4 deconditioned state Recommend exercise as tolerated   Patient is high risk for intra/post op complications  Patient satisfied with Plan of action and management. All questions answered Follow-up after 6 minute walk and ONO completed   Kamile Fassler Patricia Pesa, M.D.  [facility]  Medical Director [place] Director [facility] Cardio-Pulmonary Department

## 2016-11-16 ENCOUNTER — Ambulatory Visit: Payer: Self-pay

## 2016-12-26 ENCOUNTER — Encounter: Payer: Self-pay | Admitting: Internal Medicine

## 2016-12-26 ENCOUNTER — Telehealth: Payer: Self-pay | Admitting: Internal Medicine

## 2016-12-26 NOTE — Telephone Encounter (Signed)
3 attempts to r/s appt due to provider schedule change. lmov . Mailed letter

## 2017-01-30 ENCOUNTER — Ambulatory Visit: Payer: Self-pay | Admitting: Internal Medicine

## 2017-07-01 ENCOUNTER — Emergency Department: Payer: Self-pay

## 2017-07-01 ENCOUNTER — Other Ambulatory Visit: Payer: Self-pay

## 2017-07-01 ENCOUNTER — Encounter: Payer: Self-pay | Admitting: *Deleted

## 2017-07-01 DIAGNOSIS — Z5321 Procedure and treatment not carried out due to patient leaving prior to being seen by health care provider: Secondary | ICD-10-CM | POA: Insufficient documentation

## 2017-07-01 DIAGNOSIS — R51 Headache: Secondary | ICD-10-CM | POA: Insufficient documentation

## 2017-07-01 MED ORDER — OXYCODONE-ACETAMINOPHEN 5-325 MG PO TABS
1.0000 | ORAL_TABLET | ORAL | Status: DC | PRN
  Administered 2017-07-01: 1 via ORAL
  Filled 2017-07-01: qty 1

## 2017-07-01 NOTE — ED Triage Notes (Signed)
  Pt reporting an intermittent headache for the past three days with no hx of migraines and no hx of HTN. Pt reports the pain is the worst of his life. No neuro deficits noted at this time. No changes in vision. No light sensitivity reported. Pt is tearful in triage.

## 2017-07-02 ENCOUNTER — Emergency Department
Admission: EM | Admit: 2017-07-02 | Discharge: 2017-07-03 | Disposition: A | Discharge status: Home / Self Care | Payer: Self-pay | Attending: Emergency Medicine | Admitting: Emergency Medicine

## 2017-07-02 ENCOUNTER — Emergency Department
Admission: EM | Admit: 2017-07-02 | Discharge: 2017-07-02 | Disposition: A | Discharge status: Home / Self Care | Payer: Self-pay | Attending: Emergency Medicine | Admitting: Emergency Medicine

## 2017-07-02 ENCOUNTER — Other Ambulatory Visit: Payer: Self-pay

## 2017-07-02 DIAGNOSIS — R519 Headache, unspecified: Secondary | ICD-10-CM

## 2017-07-02 DIAGNOSIS — J449 Chronic obstructive pulmonary disease, unspecified: Secondary | ICD-10-CM | POA: Insufficient documentation

## 2017-07-02 DIAGNOSIS — Z79899 Other long term (current) drug therapy: Secondary | ICD-10-CM | POA: Insufficient documentation

## 2017-07-02 DIAGNOSIS — R51 Headache: Secondary | ICD-10-CM | POA: Insufficient documentation

## 2017-07-02 DIAGNOSIS — F17210 Nicotine dependence, cigarettes, uncomplicated: Secondary | ICD-10-CM | POA: Insufficient documentation

## 2017-07-02 MED ORDER — METOCLOPRAMIDE HCL 5 MG/ML IJ SOLN
10.0000 mg | Freq: Once | INTRAMUSCULAR | Status: AC
  Administered 2017-07-02: 10 mg via INTRAVENOUS

## 2017-07-02 MED ORDER — KETOROLAC TROMETHAMINE 30 MG/ML IJ SOLN
INTRAMUSCULAR | Status: AC
  Filled 2017-07-02: qty 1

## 2017-07-02 MED ORDER — KETOROLAC TROMETHAMINE 30 MG/ML IJ SOLN
30.0000 mg | Freq: Once | INTRAMUSCULAR | Status: AC
  Administered 2017-07-02: 30 mg via INTRAVENOUS

## 2017-07-02 MED ORDER — METOCLOPRAMIDE HCL 5 MG/ML IJ SOLN
INTRAMUSCULAR | Status: AC
  Filled 2017-07-02: qty 2

## 2017-07-02 NOTE — ED Notes (Signed)
ED Provider at bedside. 

## 2017-07-02 NOTE — ED Triage Notes (Signed)
Patient reports having headache off/on for 3 days reports usually recurs around 7 pm.  States seen here last pm and had CT scan, but left due to long wait time.

## 2017-07-02 NOTE — ED Notes (Signed)
Patient asked about update on wait time. Patient given update on wait time. Patient in no acute distress.

## 2017-07-02 NOTE — ED Notes (Signed)
Spoke with Dr. Jacqualine Code regarding patient, no orders at this time.

## 2017-07-02 NOTE — ED Provider Notes (Signed)
[facility] Emergency Department Provider Note   Time seen: 11:00 PM. I have reviewed the triage vital signs and the nursing notes.   HISTORY  Chief Complaint Headache    HPI Stephen Waters is a 62 y.o. male with below list of chronic medical conditions presents to the emergency department with a 4-day history of pain located in the left maxillary sinus region of the face as well as forehead unrelieved with ibuprofen and Tylenol.  Patient states that he had sinus congestion on that side as such used a "nasal spray" without any relief.  Patient currently admits to 8 out of 10 headache.  Patient denies any weakness numbness gait instability or visual changes.  Of note patient was seen in the emergency department yesterday and had a CT scan of the head performed however the patient left before being evaluated by MD.   Past Medical History:  Diagnosis Date  . COPD (chronic obstructive pulmonary disease) ([place])   . Hepatitis C carrier ([place])   . Tobacco use disorder     Patient Active Problem List   Diagnosis Date Noted  . Acute respiratory failure with hypoxia ([place]) 10/22/2014  . Acute bronchitis 10/22/2014  . Tobacco abuse 10/22/2014  . COPD exacerbation ([place]) 10/20/2014  . Carrier or suspected carrier of hepatitis C 10/20/2014    Past Surgical History:  Procedure Laterality Date  . NO PAST SURGERIES      Prior to Admission medications   Medication Sig Start Date End Date Taking? Authorizing Provider  albuterol (PROVENTIL HFA;VENTOLIN HFA) 108 (90 Base) MCG/ACT inhaler Inhale 2 puffs into the lungs every 6 (six) hours as needed for wheezing or shortness of breath. 09/25/16   Henreitta Leber, MD  fluticasone furoate-vilanterol (BREO ELLIPTA) 100-25 MCG/INH AEPB Inhale 1 puff into the lungs daily. 09/25/16   Henreitta Leber, MD  levofloxacin (LEVAQUIN) 750 MG tablet Take 1 tablet (750 mg total) by mouth daily. 09/25/16   Henreitta Leber, MD  predniSONE  (DELTASONE) 10 MG tablet Label  & dispense according to the schedule below. 5 Pills PO for 2 days then, 4 Pills PO for 2 days, 3 Pills PO for 2 days, 2 Pills PO for 2 days, 1 Pill PO for 2 days then STOP. 09/24/16   Henreitta Leber, MD  umeclidinium-vilanterol (ANORO ELLIPTA) 62.5-25 MCG/INH AEPB Inhale 1 puff into the lungs daily. 11/03/16   Flora Lipps, MD    Allergies Patient has no known allergies.  Family History  Problem Relation Age of Onset  . Leukemia Mother   . Stroke Father     Social History Social History   Tobacco Use  . Smoking status: Current Every Day Smoker    Packs/day: 2.00    Years: 40.00    Pack years: 80.00    Types: Cigarettes  . Smokeless tobacco: Never Used  Substance Use Topics  . Alcohol use: Yes    Alcohol/week: 0.0 oz    Comment: 1 beer daily  . Drug use: No    Review of Systems Constitutional: No fever/chills Eyes: No visual changes. ENT: No sore throat. Cardiovascular: Denies chest pain. Respiratory: Denies shortness of breath. Gastrointestinal: No abdominal pain.  No nausea, no vomiting.  No diarrhea.  No constipation. Genitourinary: Negative for dysuria. Musculoskeletal: Negative for neck pain.  Negative for back pain. Integumentary: Negative for rash. Neurological: Positive for headaches, negative focal weakness or numbness.  ____________________________________________   PHYSICAL EXAM:  VITAL SIGNS: ED Triage Vitals  Enc Vitals Group     BP 07/02/17 2119 (!) 146/71     Pulse Rate 07/02/17 2119 92     Resp 07/02/17 2119 18     Temp 07/02/17 2119 97.8 F (36.6 C)     Temp Source 07/02/17 2119 Oral     SpO2 07/02/17 2252 92 %     Weight --      Height --      Head Circumference --      Peak Flow --      Pain Score 07/02/17 2310 10     Pain Loc --      Pain Edu? --      Excl. in [place]? --     Constitutional: Alert and oriented. Well appearing and in no acute distress. Eyes: Conjunctivae are normal. PERRL. EOMI. Head:  Atraumatic.  Pain on maxillary sinus percussion Nose: No congestion/rhinnorhea. Mouth/Throat: Mucous membranes are moist.  Oropharynx non-erythematous. Neck: No stridor.  No meningeal signs.  Cardiovascular: Normal rate, regular rhythm. Good peripheral circulation. Grossly normal heart sounds. Respiratory: Normal respiratory effort.  No retractions. Lungs CTAB. Gastrointestinal: Soft and nontender. No distention.  Musculoskeletal: No lower extremity tenderness nor edema. No gross deformities of extremities. Neurologic:  Normal speech and language. No gross focal neurologic deficits are appreciated.  Skin:  Skin is warm, dry and intact. No rash noted. Psychiatric: Mood and affect are normal. Speech and behavior are normal.   RADIOLOGY I, [place], personally viewed and evaluated these images (plain radiographs) as part of my medical decision making, as well as reviewing the written report by the radiologist.    CLINICAL DATA:  Intermittent headache for 3 days  EXAM: CT HEAD WITHOUT CONTRAST  TECHNIQUE: Contiguous axial images were obtained from the base of the skull through the vertex without intravenous contrast.  COMPARISON:  No comparison studies available.  FINDINGS: Brain: There is no evidence for acute hemorrhage, hydrocephalus, mass lesion, or abnormal extra-axial fluid collection. No definite CT evidence for acute infarction.  Vascular: No hyperdense vessel or unexpected calcification.  Skull: No evidence for fracture. No worrisome lytic or sclerotic lesion.  Sinuses/Orbits: Mucosal thickening noted in scattered ethmoid air cells. Frontal and sphenoid sinuses are clear. No mastoid air cell effusion. Visualized portions of the globes and intraorbital fat are unremarkable.  Other: None.  IMPRESSION: No acute intracranial abnormality.   Electronically Signed   By: Misty Stanley M.D.   On: 07/01/2017  21:54    Procedures   ____________________________________________   INITIAL IMPRESSION / ASSESSMENT AND PLAN / ED COURSE  As part of my medical decision making, I reviewed the following data within the electronic MEDICAL RECORD NUMBER  62 year old male present with above-stated history and physical exam secondary to headache consistent with possible sinus headache.  Patient had a CAT scan performed the day before which revealed no acute intracranial findings.  Patient given Toradol and Reglan in the emergency department with improvement of pain.  Patient will be prescribed Augmentin for home.      ____________________________________________  FINAL CLINICAL IMPRESSION(S) / ED DIAGNOSES  Final diagnoses:  Acute nonintractable headache, unspecified headache type     MEDICATIONS GIVEN DURING THIS VISIT:  Medications  ketorolac (TORADOL) 30 MG/ML injection 30 mg (has no administration in time range)  metoCLOPramide (REGLAN) injection 10 mg (has no administration in time range)  ketorolac (TORADOL) 30 MG/ML injection (has no administration in time range)  metoCLOPramide (REGLAN) 5 MG/ML injection (has no administration in time range)  ED Discharge Orders    None       Note:  This document was prepared using Dragon voice recognition software and may include unintentional dictation errors.    Gregor Hams, MD 07/03/17 2248

## 2017-07-02 NOTE — ED Notes (Signed)
Pt states 4 days ago, about the time he was getting off work, throbbing headache started above left eye. Pt states has occurred each night on the same schedule, but once he gets to sleep, the headache resolves and is gone all day. Pt reports he drinks 2 cans of coca cola per day, and eats a biscuit for breakfast each day and maybe 3 tacos for dinner.   Pt states tylenol and ibuprofen aren't aiding in relief. Pt reports taking a congestion medicine that resolved his left sided nasal congestion

## 2017-07-03 MED ORDER — AMOXICILLIN-POT CLAVULANATE 875-125 MG PO TABS: 1.0000 | ORAL_TABLET | Freq: Two times a day (BID) | ORAL | 0 refills | Status: AC

## 2017-07-03 NOTE — ED Notes (Signed)

## 2017-07-04 ENCOUNTER — Other Ambulatory Visit: Payer: Self-pay

## 2017-07-04 DIAGNOSIS — F17210 Nicotine dependence, cigarettes, uncomplicated: Secondary | ICD-10-CM | POA: Insufficient documentation

## 2017-07-04 DIAGNOSIS — Z79899 Other long term (current) drug therapy: Secondary | ICD-10-CM | POA: Insufficient documentation

## 2017-07-04 DIAGNOSIS — J449 Chronic obstructive pulmonary disease, unspecified: Secondary | ICD-10-CM | POA: Insufficient documentation

## 2017-07-04 DIAGNOSIS — R51 Headache: Secondary | ICD-10-CM | POA: Insufficient documentation

## 2017-07-04 NOTE — ED Triage Notes (Signed)
Pt arrives to ED via [facility] from home with c/o HA x6 days. Pt seen here for same recently, r/x'd ABX. Pt reports HA has not gotten better. Pt reports taking OTC meds without relief.

## 2017-07-05 ENCOUNTER — Emergency Department
Admission: EM | Admit: 2017-07-05 | Discharge: 2017-07-05 | Disposition: A | Discharge status: Home / Self Care | Payer: Self-pay | Attending: Emergency Medicine | Admitting: Emergency Medicine

## 2017-07-05 DIAGNOSIS — R519 Headache, unspecified: Secondary | ICD-10-CM

## 2017-07-05 DIAGNOSIS — R51 Headache: Secondary | ICD-10-CM

## 2017-07-05 MED ORDER — KETOROLAC TROMETHAMINE 10 MG PO TABS: 10.0000 mg | ORAL_TABLET | Freq: Four times a day (QID) | ORAL | 0 refills | Status: DC | PRN

## 2017-07-05 MED ORDER — KETOROLAC TROMETHAMINE 60 MG/2ML IM SOLN
60.0000 mg | Freq: Once | INTRAMUSCULAR | Status: AC
  Administered 2017-07-05: 60 mg via INTRAMUSCULAR
  Filled 2017-07-05: qty 2

## 2017-07-05 NOTE — ED Provider Notes (Signed)
[facility] Emergency Department Provider Note   First MD Initiated Contact with Patient 07/05/17 [phone]     (approximate)  I have reviewed the triage vital signs and the nursing notes.   HISTORY  Chief Complaint Headache    HPI Stephen Waters is a 62 y.o. male with below list of chronic medical conditions presents to the emergency department with left frontal/maxillary sinus headache which the patient states occurs every day at approximately 6 PM after he has worked all day.  Patient states that he did not take anything for his discomfort today as he tried ibuprofen and Tylenol in the past and states that it has not worked.  Patient was recently seen by myself here in the emergency department with CT scan revealed evidence of sinusitis and as such the patient was prescribed Augmentin and advised to use over-the-counter medication for his headache.  Patient is requesting a prescription pain medication at this time.  Patient denies any weakness no numbness gait instability or visual changes.   Past Medical History:  Diagnosis Date  . COPD (chronic obstructive pulmonary disease) ([place])   . Hepatitis C carrier ([place])   . Tobacco use disorder     Patient Active Problem List   Diagnosis Date Noted  . Acute respiratory failure with hypoxia ([place]) 10/22/2014  . Acute bronchitis 10/22/2014  . Tobacco abuse 10/22/2014  . COPD exacerbation ([place]) 10/20/2014  . Carrier or suspected carrier of hepatitis C 10/20/2014    Past Surgical History:  Procedure Laterality Date  . NO PAST SURGERIES      Prior to Admission medications   Medication Sig Start Date End Date Taking? Authorizing Provider  amoxicillin-clavulanate (AUGMENTIN) 875-125 MG tablet Take 1 tablet by mouth 2 (two) times daily for 10 days. 07/03/17 07/13/17 Yes Gregor Hams, MD  albuterol (PROVENTIL HFA;VENTOLIN HFA) 108 (90 Base) MCG/ACT inhaler Inhale 2 puffs into the lungs every 6 (six) hours as  needed for wheezing or shortness of breath. 09/25/16   Henreitta Leber, MD  fluticasone furoate-vilanterol (BREO ELLIPTA) 100-25 MCG/INH AEPB Inhale 1 puff into the lungs daily. Patient not taking: Reported on 07/05/2017 09/25/16   Henreitta Leber, MD  levofloxacin (LEVAQUIN) 750 MG tablet Take 1 tablet (750 mg total) by mouth daily. Patient not taking: Reported on 07/05/2017 09/25/16   Henreitta Leber, MD  predniSONE (DELTASONE) 10 MG tablet Label  & dispense according to the schedule below. 5 Pills PO for 2 days then, 4 Pills PO for 2 days, 3 Pills PO for 2 days, 2 Pills PO for 2 days, 1 Pill PO for 2 days then STOP. Patient not taking: Reported on 07/05/2017 09/24/16   Henreitta Leber, MD  umeclidinium-vilanterol [facility] ELLIPTA) 62.5-25 MCG/INH AEPB Inhale 1 puff into the lungs daily. 11/03/16   Flora Lipps, MD    Allergies No known drug allergies  Family History  Problem Relation Age of Onset  . Leukemia Mother   . Stroke Father     Social History Social History   Tobacco Use  . Smoking status: Current Every Day Smoker    Packs/day: 2.00    Years: 40.00    Pack years: 80.00    Types: Cigarettes  . Smokeless tobacco: Never Used  Substance Use Topics  . Alcohol use: Yes    Alcohol/week: 0.0 oz    Comment: 1 beer daily  . Drug use: No    Review of Systems Constitutional: No fever/chills Eyes: No visual changes.  ENT: No sore throat. Cardiovascular: Denies chest pain. Respiratory: Denies shortness of breath. Gastrointestinal: No abdominal pain.  No nausea, no vomiting.  No diarrhea.  No constipation. Genitourinary: Negative for dysuria. Musculoskeletal: Negative for neck pain.  Negative for back pain. Integumentary: Negative for rash. Neurological: Positive for headaches, negative for focal weakness or numbness.  ____________________________________________   PHYSICAL EXAM:  VITAL SIGNS: ED Triage Vitals  Enc Vitals Group     BP 07/04/17 2210 (!) 147/87     Pulse  Rate 07/04/17 2210 75     Resp 07/04/17 2210 18     Temp 07/04/17 2210 (!) 97.3 F (36.3 C)     Temp Source 07/04/17 2210 Oral     SpO2 07/04/17 2210 95 %     Weight 07/04/17 2211 58.5 kg (129 lb)     Height 07/04/17 2211 1.778 m (5\' 10" )     Head Circumference --      Peak Flow --      Pain Score 07/04/17 2211 10     Pain Loc --      Pain Edu? --      Excl. in [place]? --     Constitutional: Alert and oriented. Well appearing and in no acute distress. Eyes: Conjunctivae are normal. PERRL. EOMI. Head: Atraumatic. Nose: No congestion/rhinnorhea.  Maxillary sinus percussion on the left Mouth/Throat: Mucous membranes are moist.  Oropharynx non-erythematous. Neck: No stridor.   Cardiovascular: Normal rate, regular rhythm. Good peripheral circulation. Grossly normal heart sounds. Respiratory: Normal respiratory effort.  No retractions. Lungs CTAB. Gastrointestinal: Soft and nontender. No distention.  Musculoskeletal: No lower extremity tenderness nor edema. No gross deformities of extremities. Neurologic:  Normal speech and language. No gross focal neurologic deficits are appreciated.  Skin:  Skin is warm, dry and intact. No rash noted. Psychiatric: Mood and affect are normal. Speech and behavior are normal. ____________________________________________     Procedures   ____________________________________________   INITIAL IMPRESSION / ASSESSMENT AND PLAN / ED COURSE  As part of my medical decision making, I reviewed the following data within the electronic MEDICAL RECORD NUMBER  62 year old male presented with above-stated history and physical exam secondary to headache.  Repeat imaging of the patient's brain was not performed as CT scan was performed on 07/01/2017 which revealed evidence of sinusitis.  Patient given IM shot of Toradol 60 mg in the emergency department will be prescribed Toradol for home. ____________________________________________  FINAL CLINICAL IMPRESSION(S) / ED  DIAGNOSES  Final diagnoses:  Acute nonintractable headache, unspecified headache type     MEDICATIONS GIVEN DURING THIS VISIT:  Medications  ketorolac (TORADOL) injection 60 mg (60 mg Intramuscular Given 07/05/17 0315)     ED Discharge Orders    None       Note:  This document was prepared using Dragon voice recognition software and may include unintentional dictation errors.    Gregor Hams, MD 07/05/17 [phone]

## 2017-07-05 NOTE — ED Notes (Signed)
Report off to allison rn  

## 2017-07-05 NOTE — ED Notes (Signed)
Pt reports left side headache above left eye and left temporal area for 5-6 days  Pt seen in ER and is taking Augmentin for sinusitis without relief.  No n/v/d.  Pt states pain worse in evening hours.  Pt alert.  Speech clear.

## 2017-07-05 NOTE — ED Notes (Signed)

## 2017-07-10 ENCOUNTER — Ambulatory Visit: Payer: Self-pay | Admitting: Pharmacy Technician

## 2017-07-10 DIAGNOSIS — Z79899 Other long term (current) drug therapy: Secondary | ICD-10-CM

## 2017-07-10 NOTE — Progress Notes (Signed)
Met with patient completed financial assistance application for [place] due to recent ED visit.  Patient agreed to be responsible for gathering financial information and forwarding to appropriate department in [facility].    Completed [facility] application and contract.  Patient agreed to all terms of the [facility] contract.  Patient to provide 2018 taxes.    Patient approved to receive medication assistance at [facility] through 2019, as long as eligibility criteria continues to be met.  Provided patient with Civil engineer, contracting based on hisparticular needs.    [facility] Prescription Application completed with patient.  Forwarded to Dr. Emilio Math at [facility] for signature.  Upon receipt of signed application from provider, [facility] Prescription Application will be submitted to [place].  [facility]

## 2017-08-13 ENCOUNTER — Other Ambulatory Visit: Payer: Self-pay

## 2017-08-13 ENCOUNTER — Emergency Department
Admission: EM | Admit: 2017-08-13 | Discharge: 2017-08-13 | Disposition: A | Discharge status: Home / Self Care | Payer: Self-pay | Attending: Emergency Medicine | Admitting: Emergency Medicine

## 2017-08-13 ENCOUNTER — Encounter: Payer: Self-pay | Admitting: Emergency Medicine

## 2017-08-13 DIAGNOSIS — Y9389 Activity, other specified: Secondary | ICD-10-CM | POA: Insufficient documentation

## 2017-08-13 DIAGNOSIS — Y998 Other external cause status: Secondary | ICD-10-CM | POA: Insufficient documentation

## 2017-08-13 DIAGNOSIS — F17210 Nicotine dependence, cigarettes, uncomplicated: Secondary | ICD-10-CM | POA: Insufficient documentation

## 2017-08-13 DIAGNOSIS — S80862A Insect bite (nonvenomous), left lower leg, initial encounter: Secondary | ICD-10-CM | POA: Insufficient documentation

## 2017-08-13 DIAGNOSIS — Y929 Unspecified place or not applicable: Secondary | ICD-10-CM | POA: Insufficient documentation

## 2017-08-13 DIAGNOSIS — Z79899 Other long term (current) drug therapy: Secondary | ICD-10-CM | POA: Insufficient documentation

## 2017-08-13 DIAGNOSIS — J449 Chronic obstructive pulmonary disease, unspecified: Secondary | ICD-10-CM | POA: Insufficient documentation

## 2017-08-13 DIAGNOSIS — T24632A Corrosion of second degree of left lower leg, initial encounter: Secondary | ICD-10-CM | POA: Insufficient documentation

## 2017-08-13 DIAGNOSIS — W57XXXA Bitten or stung by nonvenomous insect and other nonvenomous arthropods, initial encounter: Secondary | ICD-10-CM | POA: Insufficient documentation

## 2017-08-13 DIAGNOSIS — T65892A Toxic effect of other specified substances, intentional self-harm, initial encounter: Secondary | ICD-10-CM | POA: Insufficient documentation

## 2017-08-13 MED ORDER — BACITRACIN ZINC 500 UNIT/GM EX OINT
1.0000 "application " | TOPICAL_OINTMENT | Freq: Once | CUTANEOUS | Status: AC
  Administered 2017-08-13: 1 via TOPICAL
  Filled 2017-08-13: qty 0.9

## 2017-08-13 NOTE — Discharge Instructions (Signed)
Please apply antibiotic ointment 2 times per day until the area is healed.  See primary care or return to the ER for symptoms of concern.

## 2017-08-13 NOTE — ED Triage Notes (Signed)
Pt noticed a black open clogged follicle on his left calf 3 weeks ago, pt then put clorox directly on his skin at the recommendation of his friend due to thinking it was an insect bite. Pt concerned thinking that he was bitten by something, pt has a chemical burn noted to the area around the follicle

## 2017-08-13 NOTE — ED Provider Notes (Signed)
[facility] Emergency Department Provider Note  ____________________________________________  Time seen: Approximately 4:11 PM  I have reviewed the triage vital signs and the nursing notes.   HISTORY  Chief Complaint Burn   HPI Stephen Waters is a 62 y.o. male who presents to the emergency department for treatment and evaluation of a wound to the left calf that has been present for the past 3 weeks. He poured Clorox on it several times because he thought it might be some type of bug bite and thought it might keep it from becoming infected. Symptoms have not improved and the area is now sore.  Past Medical History:  Diagnosis Date  . COPD (chronic obstructive pulmonary disease) ([place])   . Hepatitis C carrier ([place])   . Tobacco use disorder     Patient Active Problem List   Diagnosis Date Noted  . Acute respiratory failure with hypoxia ([place]) 10/22/2014  . Acute bronchitis 10/22/2014  . Tobacco abuse 10/22/2014  . COPD exacerbation ([place]) 10/20/2014  . Carrier or suspected carrier of hepatitis C 10/20/2014    Past Surgical History:  Procedure Laterality Date  . NO PAST SURGERIES      Prior to Admission medications   Medication Sig Start Date End Date Taking? Authorizing Provider  albuterol (PROVENTIL HFA;VENTOLIN HFA) 108 (90 Base) MCG/ACT inhaler Inhale 2 puffs into the lungs every 6 (six) hours as needed for wheezing or shortness of breath. 09/25/16   Henreitta Leber, MD  fluticasone furoate-vilanterol (BREO ELLIPTA) 100-25 MCG/INH AEPB Inhale 1 puff into the lungs daily. Patient not taking: Reported on 07/05/2017 09/25/16   Henreitta Leber, MD  ketorolac (TORADOL) 10 MG tablet Take 1 tablet (10 mg total) by mouth every 6 (six) hours as needed. 07/05/17   Gregor Hams, MD  levofloxacin (LEVAQUIN) 750 MG tablet Take 1 tablet (750 mg total) by mouth daily. Patient not taking: Reported on 07/05/2017 09/25/16   Henreitta Leber, MD  predniSONE  (DELTASONE) 10 MG tablet Label  & dispense according to the schedule below. 5 Pills PO for 2 days then, 4 Pills PO for 2 days, 3 Pills PO for 2 days, 2 Pills PO for 2 days, 1 Pill PO for 2 days then STOP. Patient not taking: Reported on 07/05/2017 09/24/16   Henreitta Leber, MD  umeclidinium-vilanterol [facility] ELLIPTA) 62.5-25 MCG/INH AEPB Inhale 1 puff into the lungs daily. 11/03/16   Flora Lipps, MD    Allergies Patient has no known allergies.  Family History  Problem Relation Age of Onset  . Leukemia Mother   . Stroke Father     Social History Social History   Tobacco Use  . Smoking status: Current Every Day Smoker    Packs/day: 2.00    Years: 40.00    Pack years: 80.00    Types: Cigarettes  . Smokeless tobacco: Never Used  Substance Use Topics  . Alcohol use: Yes    Alcohol/week: 0.0 oz    Comment: 1 beer daily  . Drug use: No    Review of Systems  Constitutional: Negative for fever. Respiratory: Negative for cough or shortness of breath.  Musculoskeletal: Negative for myalgias Skin: Positive for wound on the left calf Neurological: Negative for numbness or paresthesias. ____________________________________________   PHYSICAL EXAM:  VITAL SIGNS: ED Triage Vitals [08/13/17 1515]  Enc Vitals Group     BP 111/70     Pulse Rate 95     Resp 18     Temp  98.1 F (36.7 C)     Temp Source Oral     SpO2 91 %     Weight 129 lb (58.5 kg)     Height 5\' 10"  (1.778 m)     Head Circumference      Peak Flow      Pain Score 0     Pain Loc      Pain Edu?      Excl. in [place]?      Constitutional: Chronically ill appearing. Eyes: Conjunctivae are clear without discharge or drainage. Nose: No rhinorrhea noted. Mouth/Throat: Airway is patent.  Neck: No stridor. Unrestricted range of motion observed.  Cardiovascular: Capillary refill is <3 seconds.  Respiratory: Respirations are even and unlabored.. Musculoskeletal: Unrestricted range of motion observed. Neurologic:  Awake, alert, and oriented x 4.  Skin:  Left calf has a raised, black area that appears as an embedded tick.  ____________________________________________   LABS (all labs ordered are listed, but only abnormal results are displayed)  Labs Reviewed - No data to display ____________________________________________  EKG  Not indicated. ____________________________________________  RADIOLOGY  Not indicated. ____________________________________________   PROCEDURES  .Foreign Body Removal Date/Time: 08/13/2017 4:27 PM Performed by: Victorino Dike, FNP Authorized by: Victorino Dike, FNP  Consent: Verbal consent obtained. Consent given by: patient Patient understanding: patient states understanding of the procedure being performed Body area: skin General location: lower extremity Location details: left lower leg Anesthesia: see MAR for details Complexity: simple 1 objects recovered. Post-procedure assessment: foreign body removed Comments: Appears as dead tick   ____________________________________________   INITIAL IMPRESSION / ASSESSMENT AND PLAN / ED COURSE  Stephen Waters is a 62 y.o. male who presents to the emergency department for evaluation and treatment of a wound to the left calf. What appears to be a dead tick was pulled from the skin. Bacitracin applied. Patient will be encouraged to use antibiotic ointment on the area 2 times per day until healed. He was encouraged to see a PCP or return to the ER for symptoms that are not improving over the next few days.  Medications  bacitracin ointment 1 application (has no administration in time range)     Pertinent labs & imaging results that were available during my care of the patient were reviewed by me and considered in my medical decision making (see chart for details). ____________________________________________   FINAL CLINICAL IMPRESSION(S) / ED DIAGNOSES  Final diagnoses:  Tick bite with  subsequent removal of tick  Partial thickness chemical burn of left lower leg, initial encounter    ED Discharge Orders    None       Note:  This document was prepared using Dragon voice recognition software and may include unintentional dictation errors.    Victorino Dike, FNP 08/13/17 1633    Earleen Newport, MD 08/13/17 [phone]

## 2017-09-09 DIAGNOSIS — C140 Malignant neoplasm of pharynx, unspecified: Secondary | ICD-10-CM

## 2017-09-09 HISTORY — DX: Malignant neoplasm of pharynx, unspecified: C14.0

## 2017-09-14 ENCOUNTER — Emergency Department: Payer: Medicaid Other

## 2017-09-14 ENCOUNTER — Emergency Department
Admission: EM | Admit: 2017-09-14 | Discharge: 2017-09-14 | Disposition: A | Discharge status: Home / Self Care | Payer: Medicaid Other | Attending: Student in an Organized Health Care Education/Training Program | Admitting: Student in an Organized Health Care Education/Training Program

## 2017-09-14 ENCOUNTER — Encounter: Payer: Self-pay | Admitting: Medical Oncology

## 2017-09-14 DIAGNOSIS — R221 Localized swelling, mass and lump, neck: Secondary | ICD-10-CM | POA: Diagnosis not present

## 2017-09-14 DIAGNOSIS — C113 Malignant neoplasm of anterior wall of nasopharynx: Secondary | ICD-10-CM | POA: Insufficient documentation

## 2017-09-14 DIAGNOSIS — F17210 Nicotine dependence, cigarettes, uncomplicated: Secondary | ICD-10-CM | POA: Insufficient documentation

## 2017-09-14 DIAGNOSIS — Z79899 Other long term (current) drug therapy: Secondary | ICD-10-CM | POA: Insufficient documentation

## 2017-09-14 DIAGNOSIS — J449 Chronic obstructive pulmonary disease, unspecified: Secondary | ICD-10-CM | POA: Diagnosis not present

## 2017-09-14 DIAGNOSIS — C799 Secondary malignant neoplasm of unspecified site: Secondary | ICD-10-CM

## 2017-09-14 LAB — BASIC METABOLIC PANEL
Anion gap: 10 (ref 5–15)
BUN: 13 mg/dL (ref 6–20)
CO2: 29 mmol/L (ref 22–32)
CREATININE: 0.64 mg/dL (ref 0.61–1.24)
Calcium: 9.6 mg/dL (ref 8.9–10.3)
Chloride: 98 mmol/L — ABNORMAL LOW (ref 101–111)
GFR calc non Af Amer: >60 mL/min (ref >60)
Glucose, Bld: 90 mg/dL (ref 65–99)
Potassium: 4.2 mmol/L (ref 3.5–5.1)
SODIUM: 137 mmol/L (ref 135–145)

## 2017-09-14 LAB — CBC WITH DIFFERENTIAL/PLATELET
BASOS PCT: 1 %
Basophils Absolute: 0.1 10*3/uL (ref 0–0.1)
EOS ABS: 0.2 10*3/uL (ref 0–0.7)
EOS PCT: 2 %
HCT: 48 % (ref 40.0–52.0)
HEMOGLOBIN: 16.8 g/dL (ref 13.0–18.0)
LYMPHS ABS: 1.5 10*3/uL (ref 1.0–3.6)
Lymphocytes Relative: 21 %
MCH: 34.4 pg — AB (ref 26.0–34.0)
MCHC: 35.1 g/dL (ref 32.0–36.0)
MCV: 98.1 fL (ref 80.0–100.0)
MONOS PCT: 12 %
Monocytes Absolute: 0.8 10*3/uL (ref 0.2–1.0)
NEUTROS PCT: 64 %
Neutro Abs: 4.5 10*3/uL (ref 1.4–6.5)
PLATELETS: 245 10*3/uL (ref 150–440)
RBC: 4.89 MIL/uL (ref 4.40–5.90)
RDW: 13.2 % (ref 11.5–14.5)
WBC: 7.1 10*3/uL (ref 3.8–10.6)

## 2017-09-14 MED ORDER — ALPRAZOLAM 0.25 MG PO TABS: 0.2500 mg | ORAL_TABLET | Freq: Three times a day (TID) | ORAL | 0 refills | Status: AC | PRN

## 2017-09-14 MED ORDER — IOHEXOL 300 MG/ML  SOLN
75.0000 mL | Freq: Once | INTRAMUSCULAR | Status: AC | PRN
  Administered 2017-09-14: 75 mL via INTRAVENOUS
  Filled 2017-09-14: qty 75

## 2017-09-14 NOTE — ED Notes (Signed)
See triage note  States he noticed swelling behind and under left ear  States he noticed this am  No fever or trauma  Min pain

## 2017-09-14 NOTE — ED Provider Notes (Signed)
[facility] Emergency Department Provider Note ____________________________________________  Time seen: 1040  I have reviewed the triage vital signs and the nursing notes.  HISTORY  Chief Complaint  Mass  HPI Stephen Waters is a 62 y.o. male with a history of COPD, hepatitis C, and a 80 pack-year smoking history, presents himself to the ED for evaluation of a neck mass he noted today. He describes rubbing his face and neck this morning, and noticing that the right side was different. He reports a non-tender knot to the angle of the jaw. He denies any fevers, chills, sweats, weight loss, chest pain, or cough. He has no difficulty with swallowing, breathing, or controlling his secretions. He recalls having a head CT a few months back for severe headaches. He was ultimately diagnosed and treated for severe sinus infection.   Past Medical History:  Diagnosis Date  . COPD (chronic obstructive pulmonary disease) ([place])   . 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 Tobacco use disorder     Patient Active Problem List   Diagnosis Date Noted  . Acute respiratory failure with hypoxia ([place]) 10/22/2014  . Acute bronchitis 10/22/2014  . Tobacco abuse 10/22/2014  . COPD exacerbation ([place]) 10/20/2014  . Carrier or suspected carrier of hepatitis C 10/20/2014    Past Surgical History:  Procedure Laterality Date  . NO PAST SURGERIES      Prior to Admission medications   Medication Sig Start Date End Date Taking? Authorizing Provider  albuterol (PROVENTIL HFA;VENTOLIN HFA) 108 (90 Base) MCG/ACT inhaler Inhale 2 puffs into the lungs every 6 (six) hours as needed for wheezing or shortness of breath. 09/25/16   Henreitta Leber, MD  ALPRAZolam Duanne Moron) 0.25 MG tablet Take 1 tablet (0.25 mg total) by mouth 3 (three) times daily as needed for up to 3 days for anxiety. 09/14/17 09/17/17  Sparrow [place], Dannielle Karvonen, PA-C  fluticasone furoate-vilanterol (BREO ELLIPTA) 100-25 MCG/INH AEPB Inhale 1 puff  into the lungs daily. Patient not taking: Reported on 07/05/2017 09/25/16   Henreitta Leber, MD  ketorolac (TORADOL) 10 MG tablet Take 1 tablet (10 mg total) by mouth every 6 (six) hours as needed. 07/05/17   Gregor Hams, MD  levofloxacin (LEVAQUIN) 750 MG tablet Take 1 tablet (750 mg total) by mouth daily. Patient not taking: Reported on 07/05/2017 09/25/16   Henreitta Leber, MD  predniSONE (DELTASONE) 10 MG tablet Label  & dispense according to the schedule below. 5 Pills PO for 2 days then, 4 Pills PO for 2 days, 3 Pills PO for 2 days, 2 Pills PO for 2 days, 1 Pill PO for 2 days then STOP. Patient not taking: Reported on 07/05/2017 09/24/16   Henreitta Leber, MD  umeclidinium-vilanterol [facility] ELLIPTA) 62.5-25 MCG/INH AEPB Inhale 1 puff into the lungs daily. 11/03/16   Flora Lipps, MD    Allergies Patient has no known allergies.  Family History  Problem Relation Age of Onset  . Leukemia Mother   . Stroke Father     Social History Social History   Tobacco Use  . Smoking status: Current Every Day Smoker    Packs/day: 2.00    Years: 40.00    Pack years: 80.00    Types: Cigarettes  . Smokeless tobacco: Never Used  Substance Use Topics  . Alcohol use: Yes    Alcohol/week: 0.0 oz    Comment: 1 beer daily  . Drug use: No    Review of Systems  Constitutional: Negative  for fever, weight loss Eyes: Negative for visual changes. ENT: Negative for sore throat, nosebleeds.  Cardiovascular: Negative for chest pain. Respiratory: Negative for shortness of breath or cough. Gastrointestinal: Negative for abdominal pain, vomiting and diarrhea. Genitourinary: Negative for dysuria. Musculoskeletal: Negative for back pain. Left neck mass as above Skin: Negative for rash. Neurological: Negative for headaches, focal weakness or numbness. ____________________________________________  PHYSICAL EXAM:  VITAL SIGNS: ED Triage Vitals  Enc Vitals Group     BP 09/14/17 0928 117/70      Pulse Rate 09/14/17 0928 (!) 102     Resp 09/14/17 0928 16     Temp 09/14/17 0928 97.8 F (36.6 C)     Temp Source 09/14/17 0928 Oral     SpO2 09/14/17 0928 92 %     Weight 09/14/17 0930 129 lb (58.5 kg)     Height 09/14/17 0930 5\' 10"  (1.778 m)     Head Circumference --      Peak Flow --      Pain Score 09/14/17 0930 0     Pain Loc --      Pain Edu? --      Excl. in [place]? --     Constitutional: Alert and oriented. Well appearing and in no distress. Head: Normocephalic and atraumatic. Eyes: Conjunctivae are normal. Normal extraocular movements Ears: Canals clear. TMs intact bilaterally. Nose: No congestion/rhinorrhea/epistaxis. Mouth/Throat: Mucous membranes are moist. Edentulous gums. No sublingual edema or oral lesions noted.  Neck: Supple. No thyromegaly. Palpable, firm, fixed, non-mobile mass noted to the neck at the angle of the left jaw. It is non-tender the overlying skin is without erythema, edema, or warmth.  Hematological/Lymphatic/Immunological: palpable, nontender anterior cervical lymphadenopathy, bilaterally. No supra-/infraclavicular lymphadenopathy Cardiovascular: Normal rate, regular rhythm. Grade II murmur noted. Normal distal pulses. Respiratory: Normal respiratory effort. No wheezes/rales/rhonchi.  Gastrointestinal: Soft and nontender. No distention. Musculoskeletal: Nontender with normal range of motion in all extremities.  Neurologic:  Normal gait without ataxia. Normal speech and language. No gross focal neurologic deficits are appreciated. Skin:  Skin is warm, dry and intact. No rash noted. Psychiatric: Mood and affect are normal. Patient exhibits appropriate insight and judgment. ____________________________________________   LABS (pertinent positives/negatives) Labs Reviewed  BASIC METABOLIC PANEL - Abnormal; Notable for the following components:      Result Value   Chloride 98 (*)    All other components within normal limits  CBC WITH  DIFFERENTIAL/PLATELET - Abnormal; Notable for the following components:   MCH 34.4 (*)    All other components within normal limits  ____________________________________________   RADIOLOGY  CT Soft Tissue Neck w/ CM  IMPRESSION: Metastatic nasopharyngeal carcinoma, with BILATERAL nasopharyngeal adenoidal enlargement, greater on the LEFT, encasing the LEFT internal carotid artery, extensive ipsilateral adenopathy, and probable skull base invasion. Tissue sampling is warranted. ____________________________________________  PROCEDURES  Procedures  ----------------------------------------- 11:19 PM on 09/14/2017 ----------------------------------------- S/W Dr. Talbert Cage regarding the patient presentation and CT findings. She will notify the patient via phone for [place] follow-up appointment for tomorrow, or Monday at the latest.  ____________________________________________  INITIAL IMPRESSION / [place] / ED COURSE  Patient with a ED evaluation of a firm left neck mass noted today on self-exam.  Patient with a history of COPD and an 80-pack-year smoking history presents for further evaluation.  The mass was concerning for malignancy, and this is confirmed on CT imaging.  The patient is given the results and his questions are answered.  He is aware that he  will be followed up by the cancer center for further imaging and biopsy procedure.  Patient is discharged with a prescription for alprazolam for anxiety as needed.  A work note is provided for the remainder of the week.  He will return to the ED as needed. ____________________________________________  FINAL CLINICAL IMPRESSION(S) / ED DIAGNOSES  Final diagnoses:  Mass of left side of neck  Metastatic carcinoma ([place])      Rylei Codispoti, Dannielle Karvonen, PA-C 09/14/17 1703    Merlyn Lot, MD 09/18/17 1545

## 2017-09-14 NOTE — ED Triage Notes (Signed)
Pt woke up this am and noticed a knot behind his left ear. Reports minimal pain. Denies fevers.

## 2017-09-14 NOTE — Discharge Instructions (Addendum)
You have a neck mass or tumor. It is concerning for cancer. You will be further evaluated by the [facility] for further treatment. Take the anxiety pill for any nervousness or stress you feel related to this diagnosis. Return to the emergency department as needed.

## 2017-09-15 ENCOUNTER — Encounter: Payer: Self-pay | Admitting: Oncology

## 2017-09-15 ENCOUNTER — Inpatient Hospital Stay: Payer: Self-pay | Attending: Oncology | Admitting: Oncology

## 2017-09-15 ENCOUNTER — Other Ambulatory Visit: Payer: Self-pay

## 2017-09-15 VITALS — BP 120/74 | HR 80 | Temp 97.4°F | Ht 69.0 in | Wt 127.1 lb

## 2017-09-15 DIAGNOSIS — Z598 Other problems related to housing and economic circumstances: Secondary | ICD-10-CM

## 2017-09-15 DIAGNOSIS — Z5989 Other problems related to housing and economic circumstances: Secondary | ICD-10-CM

## 2017-09-15 DIAGNOSIS — J449 Chronic obstructive pulmonary disease, unspecified: Secondary | ICD-10-CM

## 2017-09-15 DIAGNOSIS — Z720 Tobacco use: Secondary | ICD-10-CM

## 2017-09-15 DIAGNOSIS — J392 Other diseases of pharynx: Secondary | ICD-10-CM

## 2017-09-15 DIAGNOSIS — R221 Localized swelling, mass and lump, neck: Secondary | ICD-10-CM

## 2017-09-15 DIAGNOSIS — F17210 Nicotine dependence, cigarettes, uncomplicated: Secondary | ICD-10-CM

## 2017-09-15 NOTE — Progress Notes (Signed)
Hematology/Oncology Consult note [facility] [phone] [phone]   Patient Care Team: Patient, No Pcp Per as PCP - General (General Practice)  REFERRING PROVIDER: Emergency Room Health provider.   CHIEF COMPLAINTS/REASON FOR VISIT:  Evaluation of neck mass.   HISTORY OF PRESENTING ILLNESS:  Stephen Waters is a  62 y.o.  male with PMH listed below who was referred to me for evaluation of neck mass.  Patient recently presents to ER for evaluation of left neck mass which he noticed. Duration unknown, patient has noticed this mass for 2 days, non tender, located close to left jaw.  No associated pain, fever, chills, sweats, swallowing difficulty Headache, intermittent, for a few months. No aggravate or improving factors. He was treated as sinusitis.  COPD: chronic cough.  Chronic Hepatitis C and 80 pk.year smoking history. Works as Development worker, community.  CT neck was done in ER. CT neck reviewed which showed nasopharyngeal adenoidal enlargement, extending laterally to left with invasion into the left parapharyngeal soft tissue encasing left ICA. Bulky  Cervical adenopathy. Possible skull base invasion.   Review of Systems  Constitutional: Positive for malaise/fatigue. Negative for chills, fever and weight loss.  HENT: Negative for congestion, ear discharge, ear pain, nosebleeds, sinus pain and sore throat.        Neck mass  Eyes: Negative for double vision, photophobia, pain, discharge and redness.  Respiratory: Negative for cough, hemoptysis, sputum production, shortness of breath and wheezing.   Cardiovascular: Negative for chest pain, palpitations, orthopnea, claudication and leg swelling.  Gastrointestinal: Negative for abdominal pain, blood in stool, constipation, diarrhea, heartburn, melena, nausea and vomiting.  Genitourinary: Negative for dysuria, flank pain, frequency and hematuria.  Musculoskeletal: Negative for back pain, myalgias and neck  pain.  Skin: Negative for itching and rash.  Neurological: Negative for dizziness, tingling, tremors, focal weakness, weakness and headaches.  Endo/Heme/Allergies: Negative for environmental allergies. Does not bruise/bleed easily.  Psychiatric/Behavioral: Negative for depression and hallucinations. The patient is not nervous/anxious.     MEDICAL HISTORY:  Past Medical History:  Diagnosis Date  . COPD (chronic obstructive pulmonary disease) ([place])   . Hepatitis C carrier ([place])   . Tobacco use disorder     SURGICAL HISTORY: Past Surgical History:  Procedure Laterality Date  . NO PAST SURGERIES      SOCIAL HISTORY: Social History   Socioeconomic History  . Marital status: Widowed    Spouse name: Not on file  . Number of children: 2  . Years of education: Not on file  . Highest education level: Not on file  Occupational History  . Not on file  Social Needs  . Financial resource strain: Not on file  . Food insecurity:    Worry: Not on file    Inability: Not on file  . Transportation needs:    Medical: Not on file    Non-medical: Not on file  Tobacco Use  . Smoking status: Current Every Day Smoker    Packs/day: 2.00    Years: 50.00    Pack years: 100.00    Types: Cigarettes  . Smokeless tobacco: Never Used  Substance and Sexual Activity  . Alcohol use: Yes    Alcohol/week: 1.8 oz    Types: 3 Shots of liquor per week    Comment: 1 beer daily,  liquor daily   . Drug use: No  . Sexual activity: Not on file  Lifestyle  . Physical activity:    Days per week: Not on file  Minutes per session: Not on file  . Stress: Not on file  Relationships  . Social connections:    Talks on phone: Not on file    Gets together: Not on file    Attends religious service: Not on file    Active member of club or organization: Not on file    Attends meetings of clubs or organizations: Not on file    Relationship status: Not on file  . Intimate partner violence:    Fear of current  or ex partner: Not on file    Emotionally abused: Not on file    Physically abused: Not on file    Forced sexual activity: Not on file  Other Topics Concern  . Not on file  Social History Narrative  . Not on file    FAMILY HISTORY: Family History  Problem Relation Age of Onset  . Leukemia Mother   . Stroke Father     ALLERGIES:  has No Known Allergies.  MEDICATIONS:  Current Outpatient Medications  Medication Sig Dispense Refill  . albuterol (PROVENTIL HFA;VENTOLIN HFA) 108 (90 Base) MCG/ACT inhaler Inhale 2 puffs into the lungs every 6 (six) hours as needed for wheezing or shortness of breath. 1 Inhaler 2  . ALPRAZolam (XANAX) 0.25 MG tablet Take 1 tablet (0.25 mg total) by mouth 3 (three) times daily as needed for up to 3 days for anxiety. 9 tablet 0   No current facility-administered medications for this visit.      PHYSICAL EXAMINATION: ECOG PERFORMANCE STATUS: 0 - Asymptomatic Vitals:   09/15/17 1547  BP: 120/74  Pulse: 80  Temp: (!) 97.4 F (36.3 C)   Filed Weights   09/15/17 1547  Weight: 127 lb 2 oz (57.7 kg)    Physical Exam  Constitutional: He is oriented to person, place, and time. He appears well-developed and well-nourished. No distress.  HENT:  Head: Normocephalic and atraumatic.  Right Ear: External ear normal.  Left Ear: External ear normal.  Mouth/Throat: Oropharynx is clear and moist.  Eyes: Pupils are equal, round, and reactive to light. Conjunctivae and EOM are normal. No scleral icterus.  Neck: Normal range of motion. Neck supple.  Cardiovascular: Normal rate, regular rhythm and normal heart sounds.  Pulmonary/Chest: Effort normal and breath sounds normal. No respiratory distress. He has no wheezes. He has no rales. He exhibits no tenderness.  Abdominal: Soft. Bowel sounds are normal. He exhibits no distension and no mass. There is no tenderness.  Musculoskeletal: Normal range of motion. He exhibits no edema or deformity.  Lymphadenopathy:      He has cervical adenopathy.  Neurological: He is alert and oriented to person, place, and time. No cranial nerve deficit. Coordination normal.  Skin: Skin is warm and dry. No rash noted.  Psychiatric: He has a normal mood and affect. His behavior is normal. Thought content normal.     LABORATORY DATA:  I have reviewed the data as listed Lab Results  Component Value Date   WBC 7.1 09/14/2017   HGB 16.8 09/14/2017   HCT 48.0 09/14/2017   MCV 98.1 09/14/2017   PLT 245 09/14/2017   Recent Labs    09/23/16 0945 09/24/16 0446 09/14/17 1057  NA 139 138 137  K 4.4 4.4 4.2  CL 98* 98* 98*  CO2 32 32 29  GLUCOSE 110* 142* 90  BUN 10 13 13   CREATININE 0.64 0.70 0.64  CALCIUM 10.2 9.3 9.6  GFRNONAA >60 >60 >60  GFRAA >60 >60 >60  PROT 8.3*  --   --   ALBUMIN 4.4  --   --   AST 20  --   --   ALT 17  --   --   ALKPHOS 79  --   --   BILITOT 0.5  --   --    Iron/TIBC/Ferritin/ %Sat No results found for: IRON, TIBC, FERRITIN, IRONPCTSAT      ASSESSMENT & PLAN:  1. Neck mass   2. Nasopharyngeal mass   3. Does not have health insurance   4. Tobacco abuse    CT neck was Independently reviewed by me and discussed with patient. CT findings are concerning for head and neck malignancy and need further work up.  Plan PET scan to evaluate disease extent and also obtain US guided biopsy of left neck mass. Patient agrees with plan.  Will discuss at tumor board.  # Consult Education officer, museum.  #  Smoking cessation was discussed with patient   Orders Placed This Encounter  Procedures  . NM PET Image Initial (PI) Skull Base To Thigh    Standing Status:   Future    Standing Expiration Date:   09/16/2018    Order Specific Question:   If indicated for the ordered procedure, I authorize the administration of a radiopharmaceutical per Radiology protocol    Answer:   Yes    Order Specific Question:   Preferred imaging location?    Answer:   [facility]    Order Specific Question:    Radiology Contrast Protocol - do NOT remove file path    Answer:   [email or web address]  . Ambulatory referral to Social Work    Referral Priority:   Routine    Referral Type:   Consultation    Referral Reason:   Specialty Services Required    Referred to Provider:   Gillis Santa    Number of Visits Requested:   1    All questions were answered. The patient knows to call the clinic with any problems questions or concerns.  Return of visit: a few days to discuss pathology results.  Thank you for this kind referral and the opportunity to participate in the care of this patient. A copy of today's note is routed to referring provider  Total face to face encounter time for this patient visit was 60 min. >50% of the time was  spent in counseling and coordination of care.    Earlie Server, MD, PhD Hematology Oncology [facility] at [facility] Pager- [phone] 09/15/2017

## 2017-09-15 NOTE — Progress Notes (Signed)
Patient here today as a new patient with neck mass.

## 2017-09-19 ENCOUNTER — Other Ambulatory Visit: Payer: Self-pay | Admitting: Internal Medicine

## 2017-09-19 ENCOUNTER — Other Ambulatory Visit: Payer: Self-pay | Admitting: *Deleted

## 2017-09-19 DIAGNOSIS — R221 Localized swelling, mass and lump, neck: Secondary | ICD-10-CM

## 2017-09-21 ENCOUNTER — Ambulatory Visit: Payer: Self-pay

## 2017-09-28 ENCOUNTER — Ambulatory Visit: Admission: RE | Admit: 2017-09-28 | Payer: Self-pay | Source: Ambulatory Visit

## 2017-12-22 ENCOUNTER — Inpatient Hospital Stay
Admission: EM | Admit: 2017-12-22 | Discharge: 2018-01-09 | DRG: 640 | Disposition: E | Payer: Medicaid Other | Attending: Family Medicine | Admitting: Family Medicine

## 2017-12-22 ENCOUNTER — Emergency Department: Payer: Medicaid Other

## 2017-12-22 ENCOUNTER — Other Ambulatory Visit: Payer: Self-pay

## 2017-12-22 DIAGNOSIS — R64 Cachexia: Secondary | ICD-10-CM | POA: Diagnosis present

## 2017-12-22 DIAGNOSIS — Z806 Family history of leukemia: Secondary | ICD-10-CM

## 2017-12-22 DIAGNOSIS — I469 Cardiac arrest, cause unspecified: Secondary | ICD-10-CM | POA: Diagnosis not present

## 2017-12-22 DIAGNOSIS — Z923 Personal history of irradiation: Secondary | ICD-10-CM | POA: Diagnosis not present

## 2017-12-22 DIAGNOSIS — C119 Malignant neoplasm of nasopharynx, unspecified: Secondary | ICD-10-CM | POA: Diagnosis present

## 2017-12-22 DIAGNOSIS — F17210 Nicotine dependence, cigarettes, uncomplicated: Secondary | ICD-10-CM | POA: Diagnosis present

## 2017-12-22 DIAGNOSIS — Z9221 Personal history of antineoplastic chemotherapy: Secondary | ICD-10-CM

## 2017-12-22 DIAGNOSIS — J449 Chronic obstructive pulmonary disease, unspecified: Secondary | ICD-10-CM | POA: Diagnosis present

## 2017-12-22 DIAGNOSIS — R251 Tremor, unspecified: Secondary | ICD-10-CM

## 2017-12-22 DIAGNOSIS — Z7951 Long term (current) use of inhaled steroids: Secondary | ICD-10-CM

## 2017-12-22 DIAGNOSIS — F10231 Alcohol dependence with withdrawal delirium: Secondary | ICD-10-CM | POA: Diagnosis present

## 2017-12-22 DIAGNOSIS — R4182 Altered mental status, unspecified: Secondary | ICD-10-CM

## 2017-12-22 DIAGNOSIS — E871 Hypo-osmolality and hyponatremia: Secondary | ICD-10-CM | POA: Diagnosis present

## 2017-12-22 DIAGNOSIS — R451 Restlessness and agitation: Secondary | ICD-10-CM | POA: Diagnosis present

## 2017-12-22 DIAGNOSIS — E43 Unspecified severe protein-calorie malnutrition: Secondary | ICD-10-CM | POA: Diagnosis present

## 2017-12-22 DIAGNOSIS — B182 Chronic viral hepatitis C: Secondary | ICD-10-CM | POA: Diagnosis present

## 2017-12-22 DIAGNOSIS — Z681 Body mass index (BMI) 19 or less, adult: Secondary | ICD-10-CM

## 2017-12-22 DIAGNOSIS — Z823 Family history of stroke: Secondary | ICD-10-CM | POA: Diagnosis not present

## 2017-12-22 DIAGNOSIS — R000 Tachycardia, unspecified: Secondary | ICD-10-CM

## 2017-12-22 HISTORY — DX: Other psychoactive substance abuse, uncomplicated: F19.10

## 2017-12-22 HISTORY — DX: Malignant (primary) neoplasm, unspecified: C80.1

## 2017-12-22 HISTORY — DX: Malignant neoplasm of pharynx, unspecified: C14.0

## 2017-12-22 LAB — CBC WITH DIFFERENTIAL/PLATELET
BASOS PCT: 0 %
Band Neutrophils: 6 %
Basophils Absolute: 0 10*3/uL (ref 0–0.1)
Blasts: 0 %
EOS PCT: 0 %
Eosinophils Absolute: 0 10*3/uL (ref 0–0.7)
HCT: 36.2 % — ABNORMAL LOW (ref 40.0–52.0)
Hemoglobin: 12.7 g/dL — ABNORMAL LOW (ref 13.0–18.0)
LYMPHS ABS: 0.3 10*3/uL — AB (ref 1.0–3.6)
LYMPHS PCT: 5 %
MCH: 34 pg (ref 26.0–34.0)
MCHC: 35.1 g/dL (ref 32.0–36.0)
MCV: 96.8 fL (ref 80.0–100.0)
MONO ABS: 0.6 10*3/uL (ref 0.2–1.0)
MONOS PCT: 11 %
Metamyelocytes Relative: 0 %
Myelocytes: 0 %
NEUTROS PCT: 78 %
NRBC: 0 /100{WBCs}
Neutro Abs: 4.7 10*3/uL (ref 1.4–6.5)
OTHER: 0 %
PLATELETS: 257 10*3/uL (ref 150–440)
Promyelocytes Relative: 0 %
RBC: 3.74 MIL/uL — AB (ref 4.40–5.90)
RDW: 13.5 % (ref 11.5–14.5)
WBC: 5.6 10*3/uL (ref 3.8–10.6)

## 2017-12-22 LAB — COMPREHENSIVE METABOLIC PANEL
ALBUMIN: 3.8 g/dL (ref 3.5–5.0)
ALT: 22 U/L (ref 0–44)
AST: 18 U/L (ref 15–41)
Alkaline Phosphatase: 72 U/L (ref 38–126)
Anion gap: 8 (ref 5–15)
BUN: 11 mg/dL (ref 8–23)
CHLORIDE: 86 mmol/L — AB (ref 98–111)
CO2: 34 mmol/L — AB (ref 22–32)
Calcium: 9.3 mg/dL (ref 8.9–10.3)
Creatinine, Ser: 0.44 mg/dL — ABNORMAL LOW (ref 0.61–1.24)
GFR calc Af Amer: >60 mL/min (ref >60)
GFR calc non Af Amer: >60 mL/min (ref >60)
GLUCOSE: 100 mg/dL — AB (ref 70–99)
POTASSIUM: 4.4 mmol/L (ref 3.5–5.1)
SODIUM: 128 mmol/L — AB (ref 135–145)
Total Bilirubin: 0.4 mg/dL (ref 0.3–1.2)
Total Protein: 7 g/dL (ref 6.5–8.1)

## 2017-12-22 LAB — ETHANOL: Alcohol, Ethyl (B): <10 mg/dL (ref <10)

## 2017-12-22 LAB — TROPONIN I: Troponin I: <0.03 ng/mL (ref <0.03)

## 2017-12-22 LAB — GLUCOSE, CAPILLARY: Glucose-Capillary: 91 mg/dL (ref 70–99)

## 2017-12-22 LAB — LIPASE, BLOOD: Lipase: 21 U/L (ref 11–51)

## 2017-12-22 LAB — AMMONIA: AMMONIA: 34 umol/L (ref 9–35)

## 2017-12-22 LAB — LACTIC ACID, PLASMA: Lactic Acid, Venous: 1.1 mmol/L (ref 0.5–1.9)

## 2017-12-22 MED ORDER — HALOPERIDOL LACTATE 5 MG/ML IJ SOLN
5.0000 mg | Freq: Once | INTRAMUSCULAR | Status: AC
  Administered 2017-12-22: 5 mg via INTRAMUSCULAR

## 2017-12-22 MED ORDER — MORPHINE SULFATE (PF) 2 MG/ML IV SOLN
2.0000 mg | Freq: Once | INTRAVENOUS | Status: AC
  Administered 2017-12-22: 2 mg via INTRAVENOUS
  Filled 2017-12-22: qty 1

## 2017-12-22 MED ORDER — ENOXAPARIN SODIUM 40 MG/0.4ML ~~LOC~~ SOLN
40.0000 mg | SUBCUTANEOUS | Status: DC
  Administered 2017-12-22 – 2017-12-23 (×2): 40 mg via SUBCUTANEOUS
  Filled 2017-12-22 (×2): qty 0.4

## 2017-12-22 MED ORDER — VITAMIN B-1 100 MG PO TABS: 100.0000 mg | ORAL_TABLET | Freq: Every day | ORAL | Status: DC

## 2017-12-22 MED ORDER — FOLIC ACID 1 MG PO TABS: 1.0000 mg | ORAL_TABLET | Freq: Every day | ORAL | Status: DC

## 2017-12-22 MED ORDER — SODIUM CHLORIDE 0.9 % IV BOLUS
1000.0000 mL | Freq: Once | INTRAVENOUS | Status: AC
  Administered 2017-12-22: 1000 mL via INTRAVENOUS

## 2017-12-22 MED ORDER — LORAZEPAM 2 MG/ML IJ SOLN
INTRAMUSCULAR | Status: AC
  Administered 2017-12-22: 1 mg via INTRAVENOUS
  Filled 2017-12-22: qty 1

## 2017-12-22 MED ORDER — LORAZEPAM 2 MG/ML IJ SOLN
1.0000 mg | Freq: Once | INTRAMUSCULAR | Status: AC
  Administered 2017-12-22 (×2): 1 mg via INTRAVENOUS
  Filled 2017-12-22: qty 1

## 2017-12-22 MED ORDER — LORAZEPAM 1 MG PO TABS: 1.0000 mg | ORAL_TABLET | Freq: Four times a day (QID) | ORAL | Status: DC | PRN

## 2017-12-22 MED ORDER — HALOPERIDOL LACTATE 5 MG/ML IJ SOLN
INTRAMUSCULAR | Status: AC
  Filled 2017-12-22: qty 1

## 2017-12-22 MED ORDER — ADULT MULTIVITAMIN W/MINERALS CH: 1.0000 | ORAL_TABLET | Freq: Every day | ORAL | Status: DC

## 2017-12-22 MED ORDER — THIAMINE HCL 100 MG/ML IJ SOLN
100.0000 mg | Freq: Every day | INTRAMUSCULAR | Status: DC
  Administered 2017-12-23: 11:00:00 100 mg via INTRAVENOUS
  Filled 2017-12-22 (×2): qty 1

## 2017-12-22 MED ORDER — LORAZEPAM 2 MG/ML IJ SOLN
2.0000 mg | Freq: Once | INTRAMUSCULAR | Status: AC
  Administered 2017-12-22: 2 mg via INTRAVENOUS

## 2017-12-22 MED ORDER — ZIPRASIDONE MESYLATE 20 MG IM SOLR
10.0000 mg | Freq: Once | INTRAMUSCULAR | Status: AC
  Administered 2017-12-22: 10 mg via INTRAMUSCULAR

## 2017-12-22 MED ORDER — LORAZEPAM 2 MG/ML IJ SOLN
1.0000 mg | Freq: Four times a day (QID) | INTRAMUSCULAR | Status: DC | PRN
  Administered 2017-12-22: 1 mg via INTRAVENOUS
  Filled 2017-12-22: qty 1

## 2017-12-22 MED ORDER — SODIUM CHLORIDE 0.9 % IV SOLN
INTRAVENOUS | Status: DC
  Administered 2017-12-22 – 2017-12-23 (×2): via INTRAVENOUS

## 2017-12-22 MED ORDER — MORPHINE SULFATE (PF) 2 MG/ML IV SOLN: 2.0000 mg | Freq: Once | INTRAVENOUS | Status: DC

## 2017-12-22 MED ORDER — LORAZEPAM 2 MG/ML IJ SOLN
1.0000 mg | Freq: Once | INTRAMUSCULAR | Status: AC
  Administered 2017-12-22: 1 mg via INTRAVENOUS
  Filled 2017-12-22: qty 1

## 2017-12-22 MED ORDER — ZIPRASIDONE MESYLATE 20 MG IM SOLR
INTRAMUSCULAR | Status: AC
  Filled 2017-12-22: qty 20

## 2017-12-22 NOTE — ED Triage Notes (Signed)
Pt comes via [facility] from home with c/o chills and pain. Pt seen at [facility] recently for treatment. Pt has stage 4 throat/nasal cancer. VSS. Pt states pain from being exhausted. Pt is alert &O X4.

## 2017-12-22 NOTE — ED Notes (Signed)
Pt light brown color wallet given to his brother, Dominica Severin, with $400 cash inside to be taken home.   Delano Metz [phone]

## 2017-12-22 NOTE — ED Notes (Signed)
Unable to get EKG at this time. Pt unable to lie down. MD informed

## 2017-12-22 NOTE — ED Provider Notes (Signed)
[facility] Emergency Department Provider Note ____________________________________________   First MD Initiated Contact with Patient 12/27/2017 1711     (approximate)  I have reviewed the triage vital signs and the nursing notes.  HISTORY  Chief Complaint Chills  EM caveat: Patient somewhat confused, history is also provided from the patient's brothers are at the bedside  HPI Stephen Waters is a 62 y.o. male here for evaluation for shakes, worsening of his condition, also noticed some confusion is been shaking quite a bit more.  Not acting normally.  Family states he is really not eating much.  He does also have a history of alcohol abuse and was prescribed morphine from [place] which his brother reports he has also recently run out of.  He is also been having loose stools and diarrhea for the last several days including while he went to [facility] and was admitted  Past Medical History:  Diagnosis Date  . COPD (chronic obstructive pulmonary disease) ([place])   . Hepatitis C carrier ([place])   . throat/nasal   . Tobacco use disorder     Patient Active Problem List   Diagnosis Date Noted  . Acute respiratory failure with hypoxia ([place]) 10/22/2014  . Acute bronchitis 10/22/2014  . Tobacco abuse 10/22/2014  . COPD exacerbation ([place]) 10/20/2014  . Carrier or suspected carrier of hepatitis C 10/20/2014    Past Surgical History:  Procedure Laterality Date  . NO PAST SURGERIES      Prior to Admission medications   Medication Sig Start Date End Date Taking? Authorizing Provider  albuterol (PROVENTIL HFA;VENTOLIN HFA) 108 (90 Base) MCG/ACT inhaler Inhale 2 puffs into the lungs every 6 (six) hours as needed for wheezing or shortness of breath. 09/25/16   Henreitta Leber, MD    Allergies Patient has no known allergies.  Family History  Problem Relation Age of Onset  . Leukemia Mother   . Stroke Father     Social History Social History   Tobacco Use    . Smoking status: Current Every Day Smoker    Packs/day: 2.00    Years: 50.00    Pack years: 100.00    Types: Cigarettes  . Smokeless tobacco: Never Used  Substance Use Topics  . Alcohol use: Yes    Alcohol/week: 3.0 standard drinks    Types: 3 Shots of liquor per week    Comment: 1 beer daily,  liquor daily   . Drug use: No    Review of Systems Constitutional: No fever but having shaking chills Eyes: No visual changes. ENT: No sore throat. Cardiovascular: Denies chest pain. Respiratory: Denies shortness of breath. Gastrointestinal: Denies nausea or pain. Skin: Negative for rash. Neurological: Negative for headaches or weakness. ____________________________________________   PHYSICAL EXAM:  VITAL SIGNS: ED Triage Vitals  Enc Vitals Group     BP 01/04/2018 1712 135/86     Pulse Rate 12/28/2017 1712 (!) 118     Resp 12/25/2017 1712 18     Temp 12/29/2017 1712 98.7 F (37.1 C)     Temp Source 12/21/2017 1712 Oral     SpO2 12/16/2017 1712 94 %     Weight 12/30/2017 1711 110 lb (49.9 kg)     Height 12/27/2017 1711 5\' 10"  (1.778 m)     Head Circumference --      Peak Flow --      Pain Score 12/15/2017 1710 10     Pain Loc --      Pain Edu? --  Excl. in [place]? --    Constitutional: Alert and oriented to self and recognizes his brothers, but is not oriented to being at the hospital.  Frequently asking for his bed to be raised or lowered or reposition.  When asked how to position and he reports he can understand or explain it. Eyes: Conjunctivae are normal. Head: Atraumatic. Nose: No congestion/rhinnorhea. Mouth/Throat: Mucous membranes are dry. Neck: No stridor.   Cardiovascular: Tachycardic rate, regular rhythm. Grossly normal heart sounds.  Good peripheral circulation. Respiratory: Normal respiratory effort.  No retractions. Lungs CTAB. Gastrointestinal: Soft and nontender. No distention. Musculoskeletal: No lower extremity tenderness nor edema.  Shaking tremors in both upper  extremities. Neurologic:  Normal speech and language.  However, he demonstrates some agitation, frequently picking at items including cabling and IVs.  No gross focal neurologic deficits are appreciated.  Skin:  Skin is warm, dry and intact. No rash noted. Psychiatric: Mood and affect are slightly anxious, slightly agitated. ____________________________________________   LABS (all labs ordered are listed, but only abnormal results are displayed)  Labs Reviewed  COMPREHENSIVE METABOLIC PANEL - Abnormal; Notable for the following components:      Result Value   Sodium 128 (*)    Chloride 86 (*)    CO2 34 (*)    Glucose, Bld 100 (*)    Creatinine, Ser 0.44 (*)    All other components within normal limits  CBC WITH DIFFERENTIAL/PLATELET - Abnormal; Notable for the following components:   RBC 3.74 (*)    Hemoglobin 12.7 (*)    HCT 36.2 (*)    Lymphs Abs 0.3 (*)    All other components within normal limits  CULTURE, BLOOD (ROUTINE X 2)  CULTURE, BLOOD (ROUTINE X 2)  LACTIC ACID, PLASMA  LIPASE, BLOOD  TROPONIN I  GLUCOSE, CAPILLARY  LACTIC ACID, PLASMA  URINALYSIS, ROUTINE W REFLEX MICROSCOPIC  AMMONIA  CBG MONITORING, ED   ____________________________________________  EKG  EKG unable to be obtained as the patient very tremulousness, will not sit in one spot.  Telemetry tracing demonstrates what appears to be sinus tachycardia on 3-lead without ST elevation   ____________________________________________  RADIOLOGY  Dg Chest Port 1 View  Result Date: 12/29/2017 CLINICAL DATA:  Chills and pain EXAM: PORTABLE CHEST 1 VIEW COMPARISON:  09/24/2016 FINDINGS: Normal heart size. Lungs clear. No pneumothorax. No pleural effusion. IMPRESSION: No active disease. Electronically Signed   By: Marybelle Killings M.D.   On: 12/20/2017 17:49      CT of the head reviewed from [facility] done on December 18, 2017, no acute  abnormalities ____________________________________________   PROCEDURES  Procedure(s) performed: None  Procedures  Critical Care performed: Yes, see critical care note(s) CRITICAL CARE Performed by: Delman Kitten   Total critical care time: 35 minutes  Critical care time was exclusive of separately billable procedures and treating other patients.  Critical care was necessary to treat or prevent imminent or life-threatening deterioration.  Critical care was time spent personally by me on the following activities: development of treatment plan with patient and/or surrogate as well as nursing, discussions with consultants, evaluation of patient's response to treatment, examination of patient, obtaining history from patient or surrogate, ordering and performing treatments and interventions, ordering and review of laboratory studies, ordering and review of radiographic studies, pulse oximetry and re-evaluation of patient's condition.  Patient agitated, delirious requiring multiple doses of IV anxiolysis and sedative. ____________________________________________   INITIAL IMPRESSION / ASSESSMENT AND PLAN / ED COURSE  Pertinent labs & imaging  results that were available during my care of the patient were reviewed by me and considered in my medical decision making (see chart for details).  Patient presents for confusion, acting delirious on presentation.  Tremulous, agitated and confused.  Recent work-up at [facility], follow-up with [facility] as well patient's family reports symptoms seem to be returning similar to previous when he went to [facility].  Patient does have a history of alcohol use but brother reports that its only a couple of beers a day and he does not think that it is as much as what was stated in his [place] paperwork.  Additionally undergoing chemotherapy but has had to withhold this due to worsening of his condition recently and has not had chemo for about a month at least.   [place]  records reviewed, hospitalized and followed up with oncology on September 9 of this year.  His cultures no growth to date it is [facility] stay, treated empirically for possible sepsis with hypothermia at that time.  Also generalized weakness.  On morphine sulfate, gabapentin.  Additionally agitated and altered in the ER requiring Ativan, alcohol abuse and possible alcohol withdrawal are noted in the chart.  ----------------------------------------- 6:47 PM on 12/30/2017 -----------------------------------------  Patient continuing to be agitated, now becoming slightly more so sitting up at the side of the bed picking at his IVs and lines.  Not easily able to be redirected though he can somewhat.  Will give additional Ativan at this time.  Gave morphine in the event that he was starting to withdrawal given he recently ran out, but this does not appear to have had any effect.  Sodium is also low, possible etiology though only mildly hyponatremic at this time.  Is received 1 L of IV fluid.  Rest x-ray clear.  White count is normal and he was recently ruled out for sepsis at [facility] just a couple of days ago with a similar presentation at [facility].  He had a CT of the brain done on September 9 as well that showed no acute abnormality at that time with similar presentation as described by family and [facility] notes.  ----------------------------------------- 7:09 PM on 12/20/2017 -----------------------------------------  Patient becoming seemingly more agitated despite multiple doses of Ativan.  Will trial Haldol intramuscular at this time.  Family at bedside, have updated on my concerns of worsening of his condition and agitation, brother's understanding and will continue to attempt to relieve patient's agitation and hopes to further his work-up and evaluation.  He is neurologically intact without focal deficit but acting delirious.    _________________   FINAL CLINICAL IMPRESSION(S) / ED DIAGNOSES  Final  diagnoses:  None      NEW MEDICATIONS STARTED DURING THIS VISIT:  New Prescriptions   No medications on file     Note:  This document was prepared using Dragon voice recognition software and may include unintentional dictation errors.     Delman Kitten, MD 12/27/2017 [phone]

## 2017-12-22 NOTE — H&P (Addendum)
[place] at [place] NAME: Stephen Waters    MR#:  [id number]  DATE OF BIRTH:  June 17, 1955  DATE OF ADMISSION:  12/28/2017  PRIMARY CARE PHYSICIAN: Patient, No Pcp Per   REQUESTING/REFERRING PHYSICIAN: Delman Kitten MD  CHIEF COMPLAINT:   Chief Complaint  Patient presents with  . Chills    HISTORY OF PRESENT ILLNESS: Stephen Waters  is a 62 y.o. male with a known history of throat answer who is receiving chemo and radiation who was brought into the hospital with chills and agitation.  Patient was admitted to [facility] where he receives his cancer treatment for similar presentation last week.  In reviewing their notes it states that patient has a history of heavy alcohol use.  His brother who is here at bedside states that his brother does drink but drinks only few beers.  And does not feel that is heavy.  Patient had evaluation there with a CT scan of the head and work-up for his chills which was negative.  CT scan of the chest showed possible aspiration.  Patient brought to this emergency room with the same complaints of agitation and chills.  Patient is currently severely agitated trying to pick at things unable to provide me with any review of systems.   PAST MEDICAL HISTORY:   Past Medical History:  Diagnosis Date  . COPD (chronic obstructive pulmonary disease) ([place])   . Hepatitis C carrier ([place])   . throat/nasal   . Tobacco use disorder     PAST SURGICAL HISTORY:  Past Surgical History:  Procedure Laterality Date  . NO PAST SURGERIES      SOCIAL HISTORY:  Social History   Tobacco Use  . Smoking status: Current Every Day Smoker    Packs/day: 2.00    Years: 50.00    Pack years: 100.00    Types: Cigarettes  . Smokeless tobacco: Never Used  Substance Use Topics  . Alcohol use: Yes    Alcohol/week: 3.0 standard drinks    Types: 3 Shots of liquor per week    Comment: Multiple beers daily    FAMILY HISTORY:  Family History   Problem Relation Age of Onset  . Leukemia Mother   . Stroke Father     DRUG ALLERGIES: No Known Allergies  REVIEW OF SYSTEMS:   CONSTITUTIONAL: Unable to provide due to his agitation.   MEDICATIONS AT HOME:  Prior to Admission medications   Medication Sig Start Date End Date Taking? Authorizing Provider  albuterol (PROVENTIL HFA;VENTOLIN HFA) 108 (90 Base) MCG/ACT inhaler Inhale 2 puffs into the lungs every 6 (six) hours as needed for wheezing or shortness of breath. 09/25/16   Henreitta Leber, MD      PHYSICAL EXAMINATION:   VITAL SIGNS: Blood pressure (!) 113/93, pulse (!) 121, temperature 98.7 F (37.1 C), temperature source Oral, resp. rate (!) 44, height 5\' 10"  (1.778 m), weight 49.9 kg, SpO2 99 %.  GENERAL:  62 y.o.-year-old patient lying in the bed with no acute distress.  EYES: Pupils equal, round, reactive to light and accommodation. No scleral icterus. Extraocular muscles intact.  HEENT: Head atraumatic, normocephalic. Oropharynx and nasopharynx clear.  NECK:  Supple, no jugular venous distention. No thyroid enlargement, no tenderness.  LUNGS: Normal breath sounds bilaterally, no wheezing, rales,rhonchi or crepitation. No use of accessory muscles of respiration.  CARDIOVASCULAR: S1, S2 normal. No murmurs, rubs, or gallops.  ABDOMEN: Soft, nontender, nondistended. Bowel sounds present. No organomegaly or mass.  EXTREMITIES: No pedal edema, cyanosis, or clubbing.  NEUROLOGIC: Patient picking at things is not able to communicate PSYCHIATRIC: Confused and agitated SKIN: No obvious rash, lesion, or ulcer.   LABORATORY PANEL:   CBC Recent Labs  Lab 12/21/2017 1720  WBC 5.6  HGB 12.7*  HCT 36.2*  PLT 257  MCV 96.8  MCH 34.0  MCHC 35.1  RDW 13.5  LYMPHSABS 0.3*  MONOABS 0.6  EOSABS 0.0  BASOSABS 0.0   ------------------------------------------------------------------------------------------------------------------  Chemistries  Recent Labs  Lab  12/16/2017 1720  NA 128*  K 4.4  CL 86*  CO2 34*  GLUCOSE 100*  BUN 11  CREATININE 0.44*  CALCIUM 9.3  AST 18  ALT 22  ALKPHOS 72  BILITOT 0.4   ------------------------------------------------------------------------------------------------------------------ estimated creatinine clearance is 67.6 mL/min (A) (by C-G formula based on SCr of 0.44 mg/dL (L)). ------------------------------------------------------------------------------------------------------------------ No results for input(s): TSH, T4TOTAL, T3FREE, THYROIDAB in the last 72 hours.  Invalid input(s): FREET3   Coagulation profile No results for input(s): INR, PROTIME in the last 168 hours. ------------------------------------------------------------------------------------------------------------------- No results for input(s): DDIMER in the last 72 hours. -------------------------------------------------------------------------------------------------------------------  Cardiac Enzymes Recent Labs  Lab 12/13/2017 1720  TROPONINI <0.03   ------------------------------------------------------------------------------------------------------------------ Invalid input(s): POCBNP  ---------------------------------------------------------------------------------------------------------------  Urinalysis    Component Value Date/Time   COLORURINE YELLOW (A) 10/20/2014 2106   APPEARANCEUR CLEAR (A) 10/20/2014 2106   LABSPEC 1.049 (H) 10/20/2014 2106   PHURINE 6.0 10/20/2014 2106   GLUCOSEU NEGATIVE 10/20/2014 2106   HGBUR NEGATIVE 10/20/2014 2106   BILIRUBINUR NEGATIVE 10/20/2014 2106   KETONESUR NEGATIVE 10/20/2014 2106   PROTEINUR NEGATIVE 10/20/2014 2106   NITRITE NEGATIVE 10/20/2014 2106   LEUKOCYTESUR NEGATIVE 10/20/2014 2106     RADIOLOGY: Dg Chest Port 1 View  Result Date: 12/20/2017 CLINICAL DATA:  Chills and pain EXAM: PORTABLE CHEST 1 VIEW COMPARISON:  09/24/2016 FINDINGS: Normal heart size.  Lungs clear. No pneumothorax. No pleural effusion. IMPRESSION: No active disease. Electronically Signed   By: Marybelle Killings M.D.   On: 01/08/2018 17:49    EKG: Orders placed or performed during the hospital encounter of 12/20/2017  . ED EKG 12-Lead  . ED EKG 12-Lead    IMPRESSION AND PLAN: Patient 62 year old being admitted for agitation  1.  Agitation and confusion patient appears to be withdrawing, however other causes needs to be rule out will ask neuro and psych to see I will place him on a CIWA protocol I will give him a dose of Geodon Psychiatry consult Bedside sitter amonia level pending  2.  Throat cancer needs to follow-up with primary oncologist  3.  Hyponatremia give IV fluids  4.  Hepatitis C  5.  COPD without exasperation will use PRN neb   All the records are reviewed and case discussed with ED provider. Management plans discussed with the patient, family and they are in agreement.  CODE STATUS: Code Status History    Date Active Date Inactive Code Status Order ID Comments User Context   09/23/2016 1111 09/25/2016 1628 Full Code [id number]  Hillary Bow, MD ED   10/20/2014 2317 10/22/2014 1601 Full Code [id number]  Lance Coon, MD Inpatient       TOTAL TIME TAKING CARE OF THIS PATIENT:43minutes.    Dustin Flock M.D on 01/08/2018 at 7:18 PM  Between 7am to 6pm - Pager - [phone]  After 6pm go to [email or web address] - password [email or web address] [facility]  [employer] Office  [phone]  CC: Primary care physician; Patient, No Pcp Per

## 2017-12-23 ENCOUNTER — Inpatient Hospital Stay: Payer: Medicaid Other

## 2017-12-23 DIAGNOSIS — R4182 Altered mental status, unspecified: Secondary | ICD-10-CM

## 2017-12-23 LAB — BASIC METABOLIC PANEL
ANION GAP: 9 (ref 5–15)
BUN: 6 mg/dL — ABNORMAL LOW (ref 8–23)
CHLORIDE: 86 mmol/L — AB (ref 98–111)
CO2: 37 mmol/L — AB (ref 22–32)
Calcium: 8.2 mg/dL — ABNORMAL LOW (ref 8.9–10.3)
Creatinine, Ser: 0.37 mg/dL — ABNORMAL LOW (ref 0.61–1.24)
GFR calc non Af Amer: >60 mL/min (ref >60)
Glucose, Bld: 100 mg/dL — ABNORMAL HIGH (ref 70–99)
POTASSIUM: 3.9 mmol/L (ref 3.5–5.1)
Sodium: 132 mmol/L — ABNORMAL LOW (ref 135–145)

## 2017-12-23 LAB — CBC
HEMATOCRIT: 32.5 % — AB (ref 40.0–52.0)
HEMOGLOBIN: 11.3 g/dL — AB (ref 13.0–18.0)
MCH: 34.3 pg — ABNORMAL HIGH (ref 26.0–34.0)
MCHC: 34.9 g/dL (ref 32.0–36.0)
MCV: 98.3 fL (ref 80.0–100.0)
Platelets: 218 10*3/uL (ref 150–440)
RBC: 3.31 MIL/uL — ABNORMAL LOW (ref 4.40–5.90)
RDW: 13.5 % (ref 11.5–14.5)
WBC: 6.3 10*3/uL (ref 3.8–10.6)

## 2017-12-23 LAB — VITAMIN B12: Vitamin B-12: 258 pg/mL (ref 180–914)

## 2017-12-23 LAB — TSH: TSH: 0.518 u[IU]/mL (ref 0.350–4.500)

## 2017-12-23 MED ORDER — THIAMINE HCL 100 MG/ML IJ SOLN
100.0000 mg | Freq: Every day | INTRAMUSCULAR | Status: DC
  Filled 2017-12-23: qty 1

## 2017-12-23 MED ORDER — LORAZEPAM 2 MG/ML IJ SOLN
1.0000 mg | Freq: Four times a day (QID) | INTRAMUSCULAR | Status: DC | PRN
  Administered 2017-12-23 (×3): 1 mg via INTRAVENOUS
  Filled 2017-12-23 (×3): qty 1

## 2017-12-23 MED ORDER — ZIPRASIDONE MESYLATE 20 MG IM SOLR
20.0000 mg | Freq: Once | INTRAMUSCULAR | Status: AC
  Administered 2017-12-23: 20 mg via INTRAMUSCULAR
  Filled 2017-12-23: qty 20

## 2017-12-23 MED ORDER — IPRATROPIUM-ALBUTEROL 0.5-2.5 (3) MG/3ML IN SOLN: 3.0000 mL | RESPIRATORY_TRACT | Status: DC | PRN

## 2017-12-23 MED ORDER — IOHEXOL 300 MG/ML  SOLN
75.0000 mL | Freq: Once | INTRAMUSCULAR | Status: AC | PRN
  Administered 2017-12-23: 75 mL via INTRAVENOUS

## 2017-12-23 MED ORDER — FOLIC ACID 1 MG PO TABS: 1.0000 mg | ORAL_TABLET | Freq: Every day | ORAL | Status: DC

## 2017-12-23 MED ORDER — ZIPRASIDONE MESYLATE 20 MG IM SOLR
10.0000 mg | Freq: Every day | INTRAMUSCULAR | Status: DC
  Administered 2017-12-23: 10 mg via INTRAMUSCULAR
  Filled 2017-12-23: qty 20

## 2017-12-23 MED ORDER — VITAMIN B-1 100 MG PO TABS: 100.0000 mg | ORAL_TABLET | Freq: Every day | ORAL | Status: DC

## 2017-12-23 MED ORDER — LORAZEPAM 2 MG/ML IJ SOLN: 0.0000 mg | Freq: Two times a day (BID) | INTRAMUSCULAR | Status: DC

## 2017-12-23 MED ORDER — DEXTROSE-NACL 5-0.9 % IV SOLN
INTRAVENOUS | Status: DC
  Administered 2017-12-23 (×2): via INTRAVENOUS

## 2017-12-23 MED ORDER — LORAZEPAM 2 MG/ML IJ SOLN
0.0000 mg | Freq: Four times a day (QID) | INTRAMUSCULAR | Status: DC
  Administered 2017-12-23: 14:00:00 4 mg via INTRAVENOUS
  Administered 2017-12-23 (×2): 2 mg via INTRAVENOUS
  Administered 2017-12-23: 08:00:00 4 mg via INTRAVENOUS
  Filled 2017-12-23: qty 1
  Filled 2017-12-23: qty 2
  Filled 2017-12-23: qty 1
  Filled 2017-12-23: qty 2

## 2017-12-23 MED ORDER — ADULT MULTIVITAMIN W/MINERALS CH: 1.0000 | ORAL_TABLET | Freq: Every day | ORAL | Status: DC

## 2017-12-23 MED ORDER — LORAZEPAM 1 MG PO TABS: 1.0000 mg | ORAL_TABLET | Freq: Four times a day (QID) | ORAL | Status: DC | PRN

## 2017-12-23 MED ORDER — ZIPRASIDONE HCL 20 MG PO CAPS
20.0000 mg | ORAL_CAPSULE | Freq: Every day | ORAL | Status: DC
  Filled 2017-12-23 (×2): qty 1

## 2017-12-23 NOTE — Progress Notes (Signed)
Clarified with Ferne Coe, RN  From ED that meds were given and arevcharted on ma.r Chared on floor that  not given on floor but given by her in ED

## 2017-12-23 NOTE — Progress Notes (Signed)
[place] at [place] NAME: Stephen Waters    MR#:  [id number]  DATE OF BIRTH:  [birth year]  SUBJECTIVE:  CHIEF COMPLAINT:   Chief Complaint  Patient presents with  . Chills   Continue DTs and discussion with nursing staff, will consult dietary given malnourished state, continue aspiration/fall/alcohol withdrawal protocols REVIEW OF SYSTEMS:  CONSTITUTIONAL: No fever, fatigue or weakness.  EYES: No blurred or double vision.  EARS, NOSE, AND THROAT: No tinnitus or ear pain.  RESPIRATORY: No cough, shortness of breath, wheezing or hemoptysis.  CARDIOVASCULAR: No chest pain, orthopnea, edema.  GASTROINTESTINAL: No nausea, vomiting, diarrhea or abdominal pain.  GENITOURINARY: No dysuria, hematuria.  ENDOCRINE: No polyuria, nocturia,  HEMATOLOGY: No anemia, easy bruising or bleeding SKIN: No rash or lesion. MUSCULOSKELETAL: No joint pain or arthritis.   NEUROLOGIC: No tingling, numbness, weakness.  PSYCHIATRY: No anxiety or depression.   ROS  DRUG ALLERGIES:  No Known Allergies  VITALS:  Blood pressure (!) 145/85, pulse (!) 145, temperature 98.1 F (36.7 C), temperature source Oral, resp. rate (!) 30, height 5\' 10"  (1.778 m), weight 56.5 kg, SpO2 100 %.  PHYSICAL EXAMINATION:  GENERAL:  62 y.o.-year-old patient lying in the bed with no acute distress.  EYES: Pupils equal, round, reactive to light and accommodation. No scleral icterus. Extraocular muscles intact.  HEENT: Head atraumatic, normocephalic. Oropharynx and nasopharynx clear.  NECK:  Supple, no jugular venous distention. No thyroid enlargement, no tenderness.  LUNGS: Normal breath sounds bilaterally, no wheezing, rales,rhonchi or crepitation. No use of accessory muscles of respiration.  CARDIOVASCULAR: S1, S2 normal. No murmurs, rubs, or gallops.  ABDOMEN: Soft, nontender, nondistended. Bowel sounds present. No organomegaly or mass.  EXTREMITIES: No pedal edema, cyanosis, or  clubbing.  NEUROLOGIC: Cranial nerves II through XII are intact. Muscle strength 5/5 in all extremities. Sensation intact. Gait not checked.  PSYCHIATRIC: The patient is alert and oriented x 3.  SKIN: No obvious rash, lesion, or ulcer.   Physical Exam LABORATORY PANEL:   CBC Recent Labs  Lab 12/23/17 0924  WBC 6.3  HGB 11.3*  HCT 32.5*  PLT 218   ------------------------------------------------------------------------------------------------------------------  Chemistries  Recent Labs  Lab 12/15/2017 1720 12/23/17 0924  NA 128* 132*  K 4.4 3.9  CL 86* 86*  CO2 34* 37*  GLUCOSE 100* 100*  BUN 11 6*  CREATININE 0.44* 0.37*  CALCIUM 9.3 8.2*  AST 18  --   ALT 22  --   ALKPHOS 72  --   BILITOT 0.4  --    ------------------------------------------------------------------------------------------------------------------  Cardiac Enzymes Recent Labs  Lab 12/28/2017 1720  TROPONINI <0.03   ------------------------------------------------------------------------------------------------------------------  RADIOLOGY:  Dg Chest Port 1 View  Result Date: 12/25/2017 CLINICAL DATA:  Chills and pain EXAM: PORTABLE CHEST 1 VIEW COMPARISON:  09/24/2016 FINDINGS: Normal heart size. Lungs clear. No pneumothorax. No pleural effusion. IMPRESSION: No active disease. Electronically Signed   By: Marybelle Killings M.D.   On: 12/15/2017 17:49    ASSESSMENT AND PLAN:  Patient 62 year old being admitted for agitation  *Acute delirium tremens  Stable  Continue alcohol withdrawal protocol, aspiration/fall/seizure precautions Ammonia level was normal  *Acute on chronic moderate to severe protein energy/calorie malnutrition Secondary to alcoholism and head neck cancer  *Chronic throat cancer We need to follow-up with primary oncology status post discharge  *Acute hyponatremia Replete with IV fluids, BMP in the morning  *History of hepatitis C Stable  *COPD without  exacerbation Breathing treatments as needed  All the records are reviewed and case discussed with Care Management/Social Workerr. Management plans discussed with the patient, family and they are in agreement.  CODE STATUS: full  TOTAL TIME TAKING CARE OF THIS PATIENT: 40 minutes.     POSSIBLE D/C IN 2-5 DAYS, DEPENDING ON CLINICAL CONDITION.   Avel Peace Orie Cuttino M.D on 12/23/2017   Between 7am to 6pm - Pager - [phone]  After 6pm go to [email or web address] - password [email or web address] [facility]  Office  [phone]  CC: Primary care physician; Patient, No Pcp Per  Note: This dictation was prepared with Dragon dictation along with smaller phrase technology. Any transcriptional errors that result from this process are unintentional.

## 2017-12-23 NOTE — Progress Notes (Signed)
MD Jodell Cipro, P notified  That pt had been given at least 5 mg Ativan but pt was still trying to climb out of bed. Order for Geodon ordered per MD and administered in  LT Deltoid  Per MD's orders. Will continue to monitor to prevent injuries.

## 2017-12-23 NOTE — Progress Notes (Signed)
[place] is completing an OR generated on 9/13 @ 2248 hours, reason for consult unknown. I presented to the room and identified myself as the [facility]. Nurses x two were present along with the patient's daughter. No one knew why the OR for spiritual care was generated, pastoral presence ensued. The patient is minimally responsive and tremulous, per the daughter he is suspected of going through alcohol W/d. [facility] received a (PG) pursuant to previously rescinded code blue alert , but was being asked to tend to the family because the patient recently expired. I spoke with the daughter and nursing staff, making sure there were no immediate needs, and excused myself to attend to the recently expired patient and family. I asked either the family and or nurse to contact the [facility] staff for any future pastoral /spiritual care needs.     12/23/17 1300  Clinical Encounter Type  Visited With Patient and family together  Visit Type Initial  Referral From Physician  Consult/Referral To Chaplain  Spiritual Encounters  Spiritual Needs Other (Comment)  Stress Factors  Patient Stress Factors Health changes (unknown)  Family Stress Factors Health changes

## 2017-12-23 NOTE — Consult Note (Signed)
Reason for Consult:AMS Referring Physician: Salary  CC: AMS  HPI: Stephen Waters is an 62 y.o. male who is unable to provide any history today due to mental status.  Family not available therefore all history obtained from the chart.  Patient with a known history of throat answer who is receiving chemo and radiation who was brought into the hospital with chills and agitation.  Patient was admitted to [facility] on 9/7 where he receives his cancer treatment for similar presentation last week.  UDS was positive for opiates.  ETOH was negative.  Patient was noted as being confused and has only been getting intermittent care from his brother since his daughter is no longer able to care for him.  Is having difficulty managing his medications.   In reviewing their notes it states that patient has a history of heavy alcohol use.  His brother states that it is only few beers and does not feel that is heavy.  Patient had evaluation there with a CT scan of the head and blood work-up for his chills which was negative.  CT scan of the chest showed possible aspiration.  Patient brought to this emergency room with the same complaints of agitation and chills.  Patient is currently severely agitated trying to pick at things unable to provide me with any review of systems.   Past Medical History:  Diagnosis Date  . COPD (chronic obstructive pulmonary disease) ([place])   . Hepatitis C carrier ([place])   . Substance abuse ([place])   . 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 throat/nasal   . Tobacco use disorder     Past Surgical History:  Procedure Laterality Date  . NO PAST SURGERIES      Family History  Problem Relation Age of Onset  . Leukemia Mother   . Stroke Father     Social History:  reports that he has been smoking cigarettes. He has a 100.00 pack-year smoking history. He has never used smokeless tobacco. He reports that he drinks about 3.0 standard drinks of alcohol per week. He reports that he has current  or past drug history. Drug: Codeine.  No Known Allergies  Medications:  I have reviewed the patient's current medications. Prior to Admission:  Medications Prior to Admission  Medication Sig Dispense Refill Last Dose  . budesonide-formoterol (SYMBICORT) 160-4.5 MCG/ACT inhaler Inhale 1 puff into the lungs daily.     Marland Kitchen gabapentin (NEURONTIN) 300 MG capsule Take 1 capsule by mouth at bedtime.     Marland Kitchen morphine (MSIR) 15 MG tablet Take 1 tablet by mouth every 4 (four) hours as needed.     . tiotropium (SPIRIVA) 18 MCG inhalation capsule Place 1 capsule into inhaler and inhale daily.     Marland Kitchen albuterol (PROVENTIL HFA;VENTOLIN HFA) 108 (90 Base) MCG/ACT inhaler Inhale 2 puffs into the lungs every 6 (six) hours as needed for wheezing or shortness of breath. 1 Inhaler 2 PRN at PRN   Scheduled: . enoxaparin (LOVENOX) injection  40 mg Subcutaneous Q24H  . folic acid  1 mg Oral Daily  . LORazepam  0-4 mg Intravenous Q6H   Followed by  . [START ON 12/25/2017] LORazepam  0-4 mg Intravenous Q12H  . multivitamin with minerals  1 tablet Oral Daily  . thiamine  100 mg Oral Daily   Or  . thiamine  100 mg Intravenous Daily  . ziprasidone  20 mg Oral QHS    ROS: Unable to provide due to mental status  Physical Examination:  Blood pressure (!) 143/96, pulse (!) 114, temperature 100.1 F (37.8 C), temperature source Oral, resp. rate 20, height 5\' 10"  (1.778 m), weight 56.5 kg, SpO2 96 %.  HEENT-  Normocephalic, no lesions, without obvious abnormality.  Normal external eye and conjunctiva.  Normal TM's bilaterally.  Normal auditory canals and external ears. Normal external nose, mucus membranes and septum.  Normal pharynx.  Poor dentition Cardiovascular- S1, S2 normal, pulses palpable throughout   Lungs- chest clear, no wheezing, rales, normal symmetric air entry Abdomen- soft, non-tender; bowel sounds normal; no masses,  no organomegaly Extremities- no edema Lymph-no adenopathy  palpable Musculoskeletal-no joint tenderness, deformity or swelling Skin-warm and dry, no hyperpigmentation, vitiligo, or suspicious lesions  Neurological Examination   Mental Status: Eyes closed.  Does not respond to his name being called.  Yells with light sternal rub and asks me to stop.  Does not follow commands.  Speech minimal.   Cranial Nerves: II: Does not blink to bilateral confrontation, round, reactive to light and accommodation III,IV, VI: Intact oculocephalic maneuvers V,VII: intact corneals VIII: unable to test IX,X: unable to test XI: unable to test XII: unable to test Motor: Moves all extremities spontaneously, upper extremities more than lower extremities Sensory: Responds to noxious stimuli in the upper extremities but not in the lower extremities Deep Tendon Reflexes: 2+ with absent AJ's bilaterally Plantars: Right: downgoing   Left: downgoing Cerebellar: unable to test Gait: unable to test   Laboratory Studies:   Basic Metabolic Panel: Recent Labs  Lab 01/06/2018 1720 12/23/17 0924  NA 128* 132*  K 4.4 3.9  CL 86* 86*  CO2 34* 37*  GLUCOSE 100* 100*  BUN 11 6*  CREATININE 0.44* 0.37*  CALCIUM 9.3 8.2*    Liver Function Tests: Recent Labs  Lab 12/20/2017 1720  AST 18  ALT 22  ALKPHOS 72  BILITOT 0.4  PROT 7.0  ALBUMIN 3.8   Recent Labs  Lab 12/27/2017 1720  LIPASE 21   Recent Labs  Lab 12/25/2017 1720  AMMONIA 34    CBC: Recent Labs  Lab 12/21/2017 1720 12/23/17 0924  WBC 5.6 6.3  NEUTROABS 4.7  --   HGB 12.7* 11.3*  HCT 36.2* 32.5*  MCV 96.8 98.3  PLT 257 218    Cardiac Enzymes: Recent Labs  Lab 01/05/2018 1720  TROPONINI <0.03    BNP: Invalid input(s): POCBNP  CBG: Recent Labs  Lab 12/23/2017 1843  GLUCAP 91    Microbiology: Results for orders placed or performed during the hospital encounter of 12/10/2017  Blood Culture (routine x 2)     Status: None (Preliminary result)   Collection Time: 12/21/2017  5:20 PM  Result  Value Ref Range Status   Specimen Description BLOOD L ARM  Final   Special Requests   Final    BOTTLES DRAWN AEROBIC AND ANAEROBIC Blood Culture adequate volume   Culture   Final    NO GROWTH < 12 HOURS Performed at [facility], [address] [place], [place] [address]    Report Status PENDING  Incomplete  Blood Culture (routine x 2)     Status: None (Preliminary result)   Collection Time: 12/27/2017  5:21 PM  Result Value Ref Range Status   Specimen Description BLOOD RT ARM  Final   Special Requests   Final    BOTTLES DRAWN AEROBIC AND ANAEROBIC Blood Culture adequate volume   Culture   Final    NO GROWTH < 12 HOURS Performed at [facility],  [place], [place] [address]    Report Status PENDING  Incomplete    Coagulation Studies: No results for input(s): LABPROT, INR in the last 72 hours.  Urinalysis: No results for input(s): COLORURINE, LABSPEC, PHURINE, GLUCOSEU, HGBUR, BILIRUBINUR, KETONESUR, PROTEINUR, UROBILINOGEN, NITRITE, LEUKOCYTESUR in the last 168 hours.  Invalid input(s): APPERANCEUR  Lipid Panel:  No results found for: CHOL, TRIG, HDL, CHOLHDL, VLDL, LDLCALC  HgbA1C: No results found for: HGBA1C  Urine Drug Screen:      Component Value Date/Time   LABOPIA POSITIVE (A) 09/23/2016 1618   COCAINSCRNUR POSITIVE (A) 09/23/2016 1618   LABBENZ NONE DETECTED 09/23/2016 1618   AMPHETMU NONE DETECTED 09/23/2016 1618   THCU NONE DETECTED 09/23/2016 1618   LABBARB NONE DETECTED 09/23/2016 1618    Alcohol Level:  Recent Labs  Lab 12/17/2017 1720  ETH <10    Imaging: Dg Chest Port 1 View  Result Date: 01/02/2018 CLINICAL DATA:  Chills and pain EXAM: PORTABLE CHEST 1 VIEW COMPARISON:  09/24/2016 FINDINGS: Normal heart size. Lungs clear. No pneumothorax. No pleural effusion. IMPRESSION: No active disease. Electronically Signed   By: Marybelle Killings M.D.   On: 12/29/2017 17:49     Assessment/Plan: 62 year old male with a history of  throat cancer on chemo and radiation who presents with chills and confusion.  Has had recent admission at [facility] for same complaints.  Last seen in clinic on 9/9 and was altered as well causing them to withhold his next course of chemotherapy.  Mental status likely multifactorial but with chills can not rule out infection despite no fever and elevated wbc count.  Chest xray shows no infiltrate.  Patient hyponatremic.  With unclear ETOH and other drug history.     Recommendations: 1. EEG 2. Blood cultures, B1, B12, TSH.  UA and UDS pending 3. Head CT with and without contrast 4. If above unremarkable would consider LP   Alexis Goodell, MD Neurology [phone] 12/23/2017, 1:51 PM

## 2017-12-23 NOTE — Clinical Social Work Note (Signed)
CSW has received consult for self-neglect and homelessness. CSW will assess when able.   Santiago Bumpers, MSW, Latanya Presser [phone]

## 2017-12-25 LAB — HIV ANTIBODY (ROUTINE TESTING W REFLEX): HIV Screen 4th Generation wRfx: NONREACTIVE

## 2017-12-27 ENCOUNTER — Other Ambulatory Visit: Payer: Self-pay | Admitting: Family Medicine

## 2017-12-27 LAB — CULTURE, BLOOD (ROUTINE X 2)
CULTURE: NO GROWTH
CULTURE: NO GROWTH
Special Requests: ADEQUATE
Special Requests: ADEQUATE

## 2017-12-28 LAB — VITAMIN B1: Vitamin B1 (Thiamine): 203.9 nmol/L — ABNORMAL HIGH (ref 66.5–200.0)

## 2018-01-01 ENCOUNTER — Telehealth: Payer: Self-pay | Admitting: Pharmacy Technician

## 2018-01-09 NOTE — Progress Notes (Signed)
Code Blue @~0200AM on Sun 01-03-18. Reported wide-complex arrhythmia, w/ subsequent brady to arrest. On chart review, Mr. 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 Has had multiple hospital admissions in the preceding 47mo, appears to have been gradually decompensating over this time. Suspected etiologies may potentially include metabolic/electrolyte abnl vs. arrhythmia vs. hypoxia vs. infxn vs. hypovolemia/bleed vs. hypoglycemia.  Intubated, multiple rounds of CPR/epi. Also received Bicarb, Mag, Calcium.  I was able to contact pt's family member (brother, Khristopher Kapaun). Based on my understanding, pt's other brother Latrail Pounders) works at [employer], possibly in [employer]. Mr. Gerold Sar also has two daughters, Elmyra Ricks (?sp) and Cyril Mourning (?sp); the former lives in [place], while the latter is reportedly visiting the local area presently; contact information unavailable for both daughters. At the time I spoke to him, Mr. Jairus Tonne told me he was on his way to the hospital, but per my understanding, as of the writing of this note he has yet to be seen.  Given the acuity/severity of the pt's initial presentation, paired w/ his significant medical comorbidity and ongoing Tx for Ca (as discussed above), pt's global state of health, as well as both his short-term and long-term prognosis, were determined to be grave, w/ further heroic efforts likely to be futile. After conferring w/ family, CPR was stopped and care was withdrawn. Time of death 18AM. I believe the pt's remains have been relocated to the [place] (Nocturnist/Hospitalist)

## 2018-01-09 NOTE — ED Provider Notes (Signed)
[facility] Department of Emergency Medicine   Code Blue CONSULT NOTE  Chief Complaint: Cardiac arrest/unresponsive   Level V Caveat: Unresponsive  History of present illness: I was contacted by the hospital for a CODE BLUE cardiac arrest upstairs and presented to the patient's bedside.  Reportedly he had been talking to a nurse and then went into a wide-complex rhythm on the monitor and went unresponsive.  He was found to be pulseless and CPR was started.  They had just started chest compressions when I arrived to the room.  He had coffee-ground emesis or gastric contents being expressed and suctioned while he was getting respirations with BVM.   ROS: Unable to obtain, Level V caveat  Scheduled Meds: . enoxaparin (LOVENOX) injection  40 mg Subcutaneous Q24H  . folic acid  1 mg Oral Daily  . LORazepam  0-4 mg Intravenous Q6H   Followed by  . [START ON 12/25/2017] LORazepam  0-4 mg Intravenous Q12H  . multivitamin with minerals  1 tablet Oral Daily  . thiamine  100 mg Oral Daily   Or  . thiamine  100 mg Intravenous Daily  . ziprasidone  10 mg Intramuscular QHS   Continuous Infusions: . dextrose 5 % and 0.9% NaCl 100 mL/hr at 12/23/17 2025   PRN Meds:.ipratropium-albuterol, LORazepam **OR** LORazepam Past Medical History:  Diagnosis Date  . COPD (chronic obstructive pulmonary disease) ([place])   . Hepatitis C carrier ([place])   . Substance abuse ([place])   . Throat cancer ([place]) 09/2017  . throat/nasal   . Tobacco use disorder    Past Surgical History:  Procedure Laterality Date  . NO PAST SURGERIES     Social History   Socioeconomic History  . Marital status: Widowed    Spouse name: Not on file  . Number of children: 2  . Years of education: 91  . Highest education level: 10th grade  Occupational History  . Occupation: plumber  Social Needs  . Financial resource strain: Somewhat hard  . Food insecurity:    Worry: Patient refused    Inability: Patient  refused  . Transportation needs:    Medical: No    Non-medical: Not on file  Tobacco Use  . Smoking status: Current Every Day Smoker    Packs/day: 2.00    Years: 50.00    Pack years: 100.00    Types: Cigarettes  . Smokeless tobacco: Never Used  Substance and Sexual Activity  . Alcohol use: Yes    Alcohol/week: 3.0 standard drinks    Types: 3 Shots of liquor per week    Comment: Multiple beers daily  . Drug use: Yes    Types: Codeine  . Sexual activity: Not Currently  Lifestyle  . Physical activity:    Days per week: Patient refused    Minutes per session: Patient refused  . Stress: Patient refused  Relationships  . Social connections:    Talks on phone: Patient refused    Gets together: Patient refused    Attends religious service: Patient refused    Active member of club or organization: Patient refused    Attends meetings of clubs or organizations: Patient refused    Relationship status: Patient refused  . Intimate partner violence:    Fear of current or ex partner: Patient refused    Emotionally abused: Patient refused    Physically abused: Patient refused    Forced sexual activity: Patient refused  Other Topics Concern  . Not on file  Social History  Narrative  . Not on file   No Known Allergies  Last set of Vital Signs (not current) Vitals:   12/23/17 1324 12/23/17 2005  BP: (!) 143/96 (!) 157/76  Pulse: (!) 114 (!) 111  Resp: 20 20  Temp: 100.1 F (37.8 C) 98.5 F (36.9 C)  SpO2: 96%       Physical Exam  Gen: unresponsive.  Cachectic and chronic critical ill appearance Cardiovascular: pulseless  Resp: apneic. Breath sounds equal bilaterally with bagging but with significant aspirations and airway heavily contaminated with coffee-ground emesis Abd: nondistended .  Gastric tube is in place Neuro: GCS 3, unresponsive to pain  HEENT: Coffee-ground emesis and airway with heavy contamination of trachea via vocal cords.  Pupils are fixed and  dilated. Neck: No crepitus  Musculoskeletal: No deformity  Skin: warm  Procedures  INTUBATION Performed by: Hinda Kehr Required items: required blood products, implants, devices, and special equipment available Patient identity confirmed: provided demographic data and hospital-assigned identification number Indications: Loss of airway Intubation method: Glide scope Preoxygenation: BVM Sedatives: None Paralytic: None Tube Size: 7.5 cuffed Post-procedure assessment: chest rise and ETCO2 monitor Breath sounds: equal and absent over the epigastrium Tube secured by Respiratory Therapy Patient tolerated the procedure well with no immediate complications.  CRITICAL CARE Performed by: Hinda Kehr Total critical care time: 30 Critical care time was exclusive of separately billable procedures and treating other patients. Critical care was necessary to treat or prevent imminent or life-threatening deterioration. Critical care was time spent personally by me on the following activities: development of treatment plan with patient and/or surrogate as well as nursing, discussions with consultants, evaluation of patient's response to treatment, examination of patient, obtaining history from patient or surrogate, ordering and performing treatments and interventions, ordering and review of laboratory studies, ordering and review of radiographic studies, pulse oximetry and re-evaluation of patient's condition.  Cardiopulmonary Resuscitation (CPR) Procedure Note  Directed/Performed by: Hinda Kehr I personally directed ancillary staff and/or performed CPR in an effort to regain return of spontaneous circulation and to maintain cardiac, neuro and systemic perfusion.    Medical Decision making  Patient critically ill reportedly with throat cancer and chronic alcoholism.  Required CPR and advanced airway  Assessment and Plan  Patient pulseless.  We performed multiple rounds of CPR.  I placed an ET  tube and was secure the airway while continuing CPR.  Please see nursing notes for details but in addition to multiple rounds of epinephrine he also received an amp of sodium bicarb, magnesium 2 g IV, calcium gluconate 1 g IV.  At one point during a rhythm check I did feel a carotid pulse but with a rate of about 20 and we continued CPR.  At the same time the hospitalist was talking with the family and to the hospital sure the extremely grave prognosis.  The family agreed to stop CPR and withdraw care.  At the next rhythm check he had a very slow rhythm in the 20s but no pulse.  We stopped at that time and time of death was declared at 2:14 AM.  Patient was unresponsive throughout with fixed and dilated pupils.    Hinda Kehr, MD 01/17/18 [phone]

## 2018-01-09 NOTE — Progress Notes (Signed)
CCMD notified this RN that the patient had a widened QRS and heart rate dropping. This RN walked in the room, patient was unresponsive. Code blue called,  Patient was pulseless and compression was started.

## 2018-01-09 NOTE — Death Summary Note (Signed)
[facility] [place] NAME: Everton Bertha    MR#:  [id number]  DATE OF BIRTH:  [birth year]  DATE OF ADMISSION:  01/08/2018 ADMITTING PHYSICIAN: Dustin Flock, MD  DATE OF DISCHARGE: 2018/01/08  2:14 AM  PRIMARY CARE PHYSICIAN: Patient, No Pcp Per    ADMISSION DIAGNOSIS:  Hyponatremia [E87.1] Tremor [R25.1] Tachycardia [R00.0] Altered mental status, unspecified altered mental status type [R41.82]  DISCHARGE DIAGNOSIS:  Active Problems:   Agitation   SECONDARY DIAGNOSIS:   Past Medical History:  Diagnosis Date  . COPD (chronic obstructive pulmonary disease) ([place])   . Hepatitis C carrier ([place])   . Substance abuse ([place])   . Throat cancer ([place]) 09/2017  . throat/nasal   . Tobacco use disorder     HOSPITAL COURSE:   Death note Patient went into acute cardiac arrest, CODE BLUE was called, BLS/ACLS performed, family decided to withdraw care, patient time of death 2:14 AM, patient without cranial nerve reflexes, no spontaneous respiration, no pulse, no response to painful stimuli   *Acute delirium tremens  *Acute on chronic moderate to severe protein energy/calorie malnutrition *Chronic throat cancer *Acute hyponatremia *History of hepatitis C *COPD without exacerbation  DISCHARGE CONDITIONS:   dead  CONSULTS OBTAINED:  Treatment Team:  Alexis Goodell, MD Elvin So, MD  DRUG ALLERGIES:  No Known Allergies  DISCHARGE MEDICATIONS:   Allergies as of 01/08/18   No Known Allergies     Medication List    ASK your doctor about these medications   albuterol 108 (90 Base) MCG/ACT inhaler Commonly known as:  PROVENTIL HFA;VENTOLIN HFA Inhale 2 puffs into the lungs every 6 (six) hours as needed for wheezing or shortness of breath.   gabapentin 300 MG capsule Commonly known as:  NEURONTIN Take 1 capsule by mouth at bedtime.   morphine 15 MG tablet Commonly known as:  MSIR Take 1 tablet by mouth  every 4 (four) hours as needed.   SYMBICORT 160-4.5 MCG/ACT inhaler Generic drug:  budesonide-formoterol Inhale 1 puff into the lungs daily.   tiotropium 18 MCG inhalation capsule Commonly known as:  SPIRIVA Place 1 capsule into inhaler and inhale daily.        DISCHARGE INSTRUCTIONS:  If you experience worsening of your admission symptoms, develop shortness of breath, life threatening emergency, suicidal or homicidal thoughts you must seek medical attention immediately by calling 911 or calling your MD immediately  if symptoms less severe.  You Must read complete instructions/literature along with all the possible adverse reactions/side effects for all the Medicines you take and that have been prescribed to you. Take any new Medicines after you have completely understood and accept all the possible adverse reactions/side effects.   Please note  You were cared for by a hospitalist during your hospital stay. If you have any questions about your discharge medications or the care you received while you were in the hospital after you are discharged, you can call the unit and asked to speak with the hospitalist on call if the hospitalist that took care of you is not available. Once you are discharged, your primary care physician will handle any further medical issues. Please note that NO REFILLS for any discharge medications will be authorized once you are discharged, as it is imperative that you return to your primary care physician (or establish a relationship with a primary care physician if you do not have one) for your aftercare needs so that they can reassess your need for  medications and monitor your lab values.    Today   CHIEF COMPLAINT:   Chief Complaint  Patient presents with  . Chills    HISTORY OF PRESENT ILLNESS:  62 y.o. male with a known history of throat answer who is receiving chemo and radiation who was brought into the hospital with chills and agitation.  Patient was  admitted to [facility] where he receives his cancer treatment for similar presentation last week.  In reviewing their notes it states that patient has a history of heavy alcohol use.  His brother who is here at bedside states that his brother does drink but drinks only few beers.  And does not feel that is heavy.  Patient had evaluation there with a CT scan of the head and work-up for his chills which was negative.  CT scan of the chest showed possible aspiration.  Patient brought to this emergency room with the same complaints of agitation and chills.  Patient is currently severely agitated trying to pick at things unable to provide me with any review of systems.   VITAL SIGNS:  Blood pressure (!) 157/76, pulse (!) 111, temperature 98.5 F (36.9 C), temperature source Oral, resp. rate 20, height 5\' 10"  (1.778 m), weight 56.5 kg, SpO2 96 %.  I/O:    Intake/Output Summary (Last 24 hours) at 01-20-18 1122 Last data filed at 12/23/2017 1500 Gross per 24 hour  Intake 394.51 ml  Output 650 ml  Net -255.49 ml    PHYSICAL EXAMINATION:  GENERAL:  62 y.o.-year-old patient lying in the bed with no acute distress.  EYES: Pupils equal, round, reactive to light and accommodation. No scleral icterus. Extraocular muscles intact.  HEENT: Head atraumatic, normocephalic. Oropharynx and nasopharynx clear.  NECK:  Supple, no jugular venous distention. No thyroid enlargement, no tenderness.  LUNGS: Normal breath sounds bilaterally, no wheezing, rales,rhonchi or crepitation. No use of accessory muscles of respiration.  CARDIOVASCULAR: S1, S2 normal. No murmurs, rubs, or gallops.  ABDOMEN: Soft, non-tender, non-distended. Bowel sounds present. No organomegaly or mass.  EXTREMITIES: No pedal edema, cyanosis, or clubbing.  NEUROLOGIC: Cranial nerves II through XII are intact. Muscle strength 5/5 in all extremities. Sensation intact. Gait not checked.  PSYCHIATRIC: The patient is alert and oriented x 3.   SKIN: No obvious rash, lesion, or ulcer.   DATA REVIEW:   CBC Recent Labs  Lab 12/23/17 0924  WBC 6.3  HGB 11.3*  HCT 32.5*  PLT 218    Chemistries  Recent Labs  Lab 01/02/2018 1720 12/23/17 0924  NA 128* 132*  K 4.4 3.9  CL 86* 86*  CO2 34* 37*  GLUCOSE 100* 100*  BUN 11 6*  CREATININE 0.44* 0.37*  CALCIUM 9.3 8.2*  AST 18  --   ALT 22  --   ALKPHOS 72  --   BILITOT 0.4  --     Cardiac Enzymes Recent Labs  Lab 01/06/2018 1720  TROPONINI <0.03    Microbiology Results  Results for orders placed or performed during the hospital encounter of 12/21/2017  Blood Culture (routine x 2)     Status: None (Preliminary result)   Collection Time: 12/13/2017  5:20 PM  Result Value Ref Range Status   Specimen Description BLOOD L ARM  Final   Special Requests   Final    BOTTLES DRAWN AEROBIC AND ANAEROBIC Blood Culture adequate volume   Culture   Final    NO GROWTH 2 DAYS Performed at [facility], [place]  [address] [place], [place] [address]    Report Status PENDING  Incomplete  Blood Culture (routine x 2)     Status: None (Preliminary result)   Collection Time: 12/19/2017  5:21 PM  Result Value Ref Range Status   Specimen Description BLOOD RT ARM  Final   Special Requests   Final    BOTTLES DRAWN AEROBIC AND ANAEROBIC Blood Culture adequate volume   Culture   Final    NO GROWTH 2 DAYS Performed at [facility], [address], [place], [place] [address]    Report Status PENDING  Incomplete    RADIOLOGY:  Ct Head W & Wo Contrast  Result Date: 12/23/2017 CLINICAL DATA:  Altered mental status. EXAM: CT HEAD WITHOUT AND WITH CONTRAST TECHNIQUE: Contiguous axial images were obtained from the base of the skull through the vertex without and with intravenous contrast CONTRAST:  80mL OMNIPAQUE IOHEXOL 300 MG/ML  SOLN COMPARISON:  Neck CT 09/14/2017 FINDINGS: Brain: No evidence of acute infarction, hemorrhage, hydrocephalus, extra-axial collection or mass  lesion/mass effect. Vascular: No hyperdense vessel or unexpected calcification. Visible vessels are patent. Skull: Sclerosis in the clivus which is stable. No visible skull base erosion. Sinuses/Orbits: Moderate mucosal thickening greatest in the ethmoids. 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 This appearance is stable if not somewhat regressed from comparison neck CT. IMPRESSION: No acute intracranial finding. No evidence of intracranial metastatic disease. Electronically Signed   By: Monte Fantasia M.D.   On: 12/23/2017 15:05   Dg Chest Port 1 View  Result Date: 12/17/2017 CLINICAL DATA:  Chills and pain EXAM: PORTABLE CHEST 1 VIEW COMPARISON:  09/24/2016 FINDINGS: Normal heart size. Lungs clear. No pneumothorax. No pleural effusion. IMPRESSION: No active disease. Electronically Signed   By: Marybelle Killings M.D.   On: 12/23/2017 17:49    EKG:   Orders placed or performed during the hospital encounter of 01/05/2018  . ED EKG 12-Lead  . ED EKG 12-Lead      Management plans discussed with the patient, family and they are in agreement.  CODE STATUS:  Code Status History    Date Active Date Inactive Code Status Order ID Comments User Context   12/19/2017 2036 08-Jan-2018 1025 Full Code [id number]  Dustin Flock, MD Inpatient   09/23/2016 1111 09/25/2016 1628 Full Code [id number]  Hillary Bow, MD ED   10/20/2014 2317 10/22/2014 1601 Full Code [id number]  Lance Coon, MD Inpatient    Advance Directive Documentation     Most Recent Value  Type of Advance Directive  Healthcare Power of Cleatrice Burke power of attorney  Daughter Stanford Breed ]  Pre-existing out of facility DNR order (yellow form or pink MOST form)  -  "MOST" Form in Place?  -      TOTAL TIME TAKING CARE OF THIS PATIENT: 40 minutes.    Avel Peace Salary M.D on 01-08-2018 at 11:22 AM  Between 7am to 6pm - Pager - [phone]  After  6pm go to [email or web address] - password [email or web address]  Office  [phone]  CC: Primary care physician; Patient, No Pcp Per   Note: This dictation was prepared with Dragon dictation along with smaller phrase technology. Any transcriptional errors that result from this process are unintentional.

## 2018-01-09 NOTE — Progress Notes (Signed)
Primary RN report a call from [place] about widening QRS, primary Rn and another nurse checked on pt, pt HR was dropping on monitor, pt was unresponsive and code blue called. Family was called and messasges left with Lula Olszewski, and [place]. Spoke with Dominica Severin and report he needs to get to the hospital as soon as possible. Waited until 0600 and no family member came to the unit. Pt body was then removed to the morgue. Nursing supervisor is aware of the situation.

## 2018-01-09 NOTE — Progress Notes (Signed)
RCP responded to code blue. CPR initiated. Pt intubated by Dr. Karma Greaser.  Unable to successfully resuscitate. Code terminated per Dr. Karma Greaser.

## 2018-01-09 DEATH — deceased

## 2019-07-18 IMAGING — CT CT HEAD WO/W CM
3 of 4 series · 15 of 47 positions shown, 18 images · IV contrast (APPLIED)
Comparison: Neck CT 09/14/2017

CLINICAL DATA: Altered mental status.

EXAM:
CT HEAD WITHOUT AND WITH CONTRAST
TECHNIQUE: Contiguous axial images were obtained from the base of the skull
through the vertex without and with intravenous contrast
CONTRAST:  75mL OMNIPAQUE IOHEXOL 300 MG/ML  SOLN

[Series 2: head wo · axial · 0.47mm/px · z∈[-123,+12]mm · 9 of 33 slices shown, 12 images]
[im 3/33  brain]
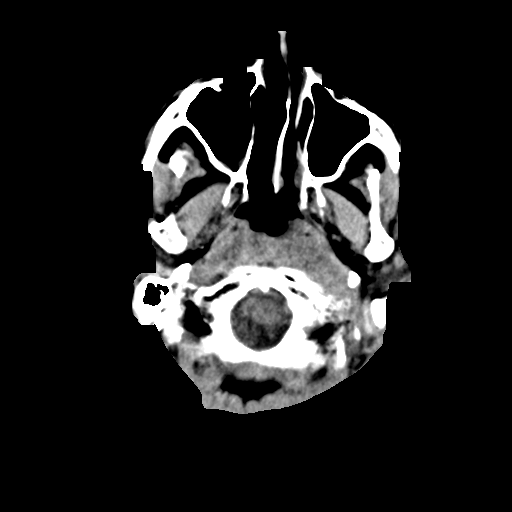
[im 3/33  bone]
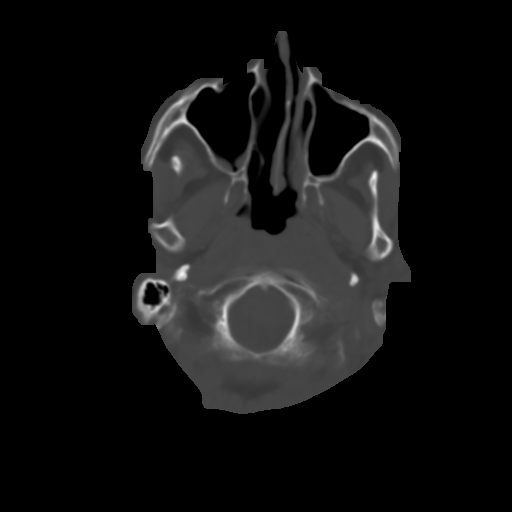
[im 7/33  brain]
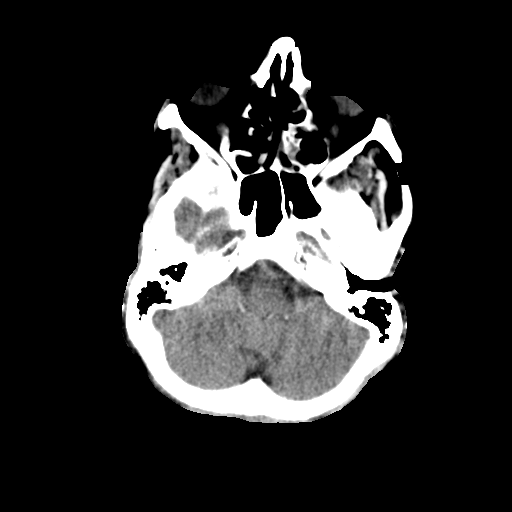
[im 10/33  brain]
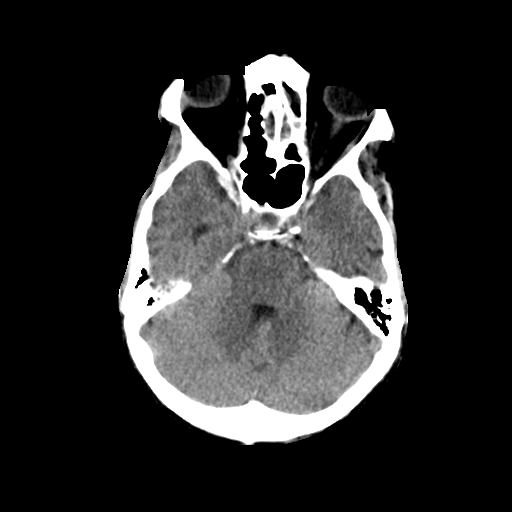
[im 14/33  brain]
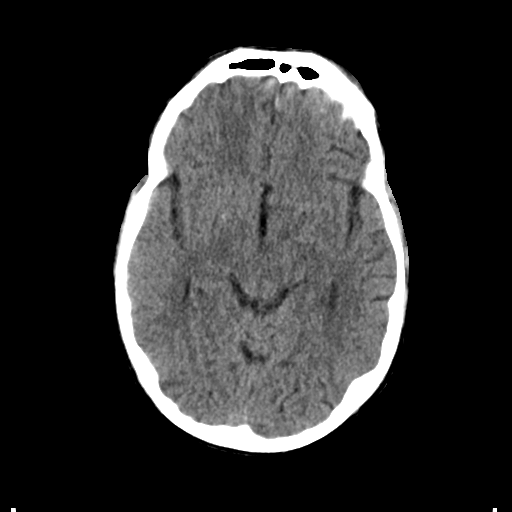
[im 17/33  brain]
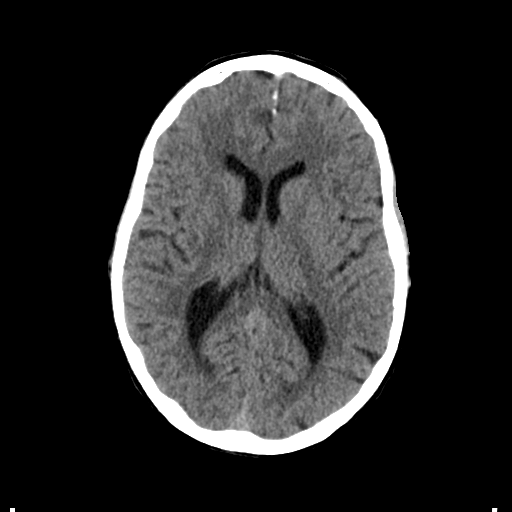
[im 17/33  bone]
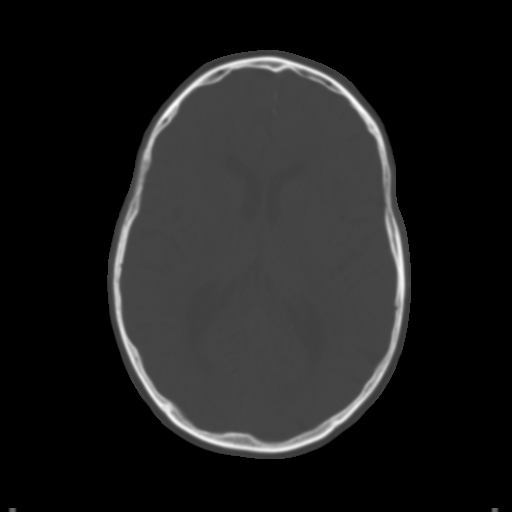
[im 19/33  brain]
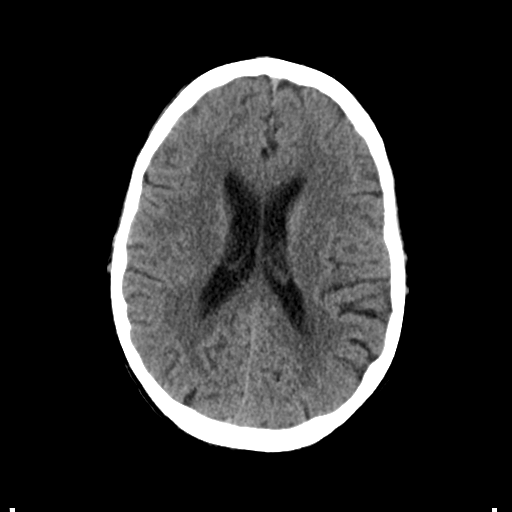
[im 23/33  brain]
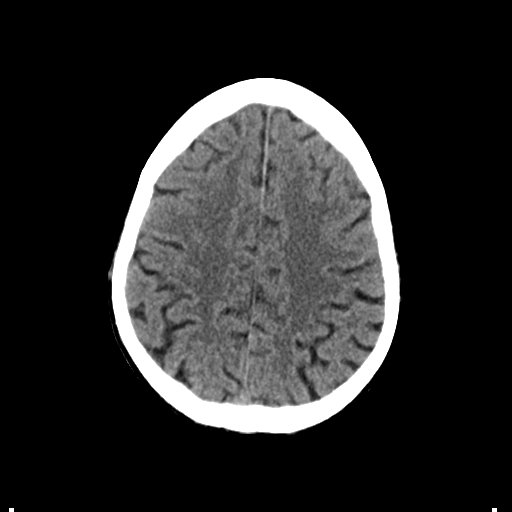
[im 26/33  brain]
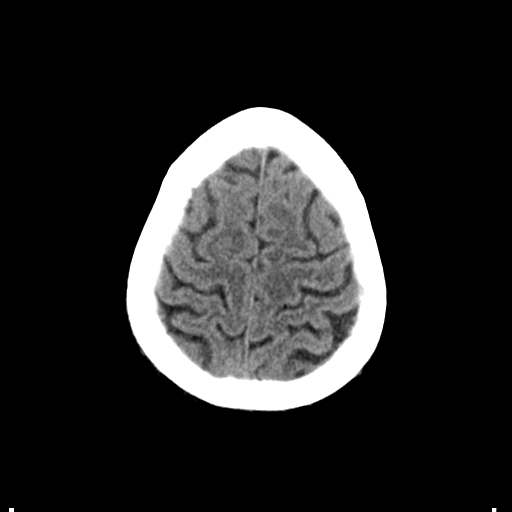
[im 30/33  brain]
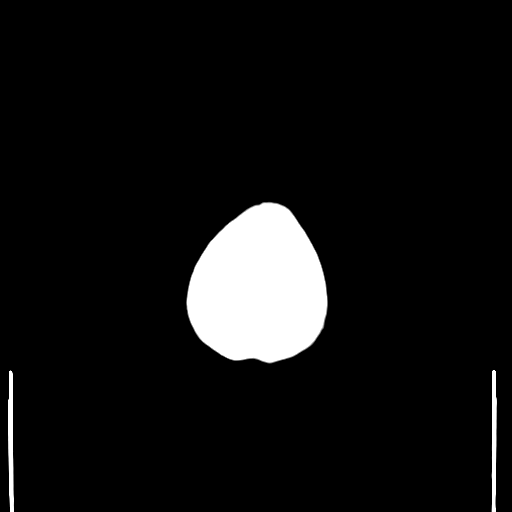
[im 30/33  bone]
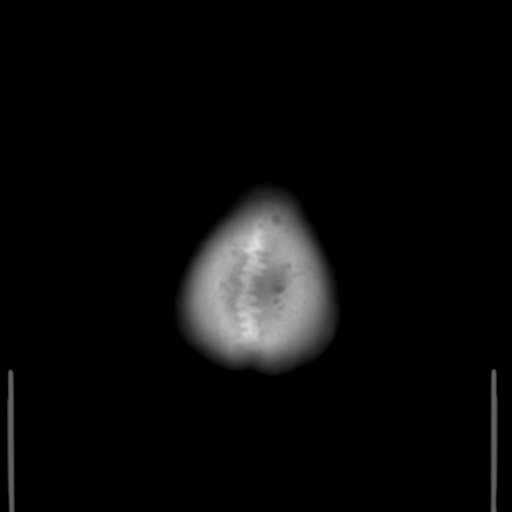

[Series 5: coronal soft tissue · coronal · 0.29mm/px · 3 of 72 slices shown]
[im 24/72  brain]
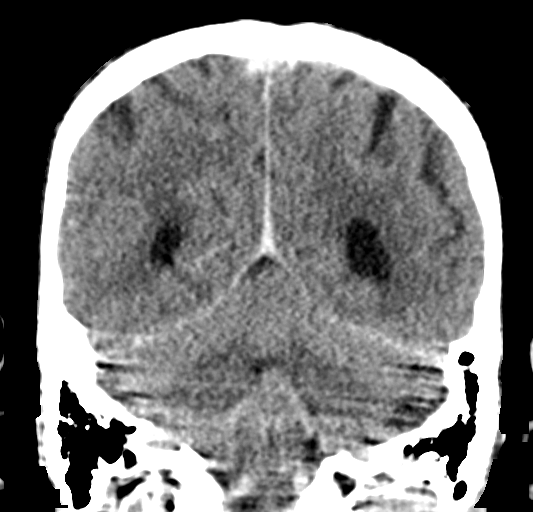
[im 32/72  brain]
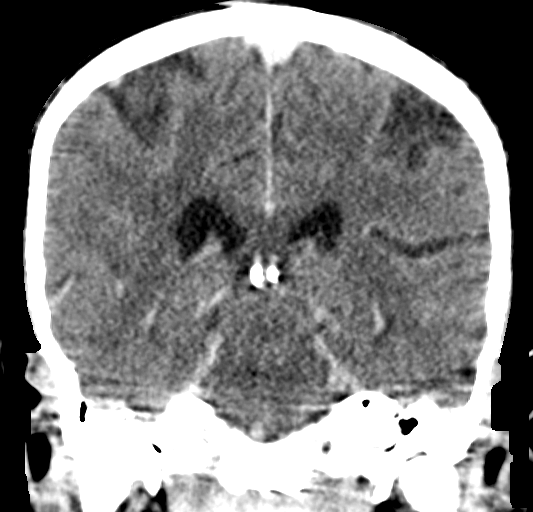
[im 40/72  brain]
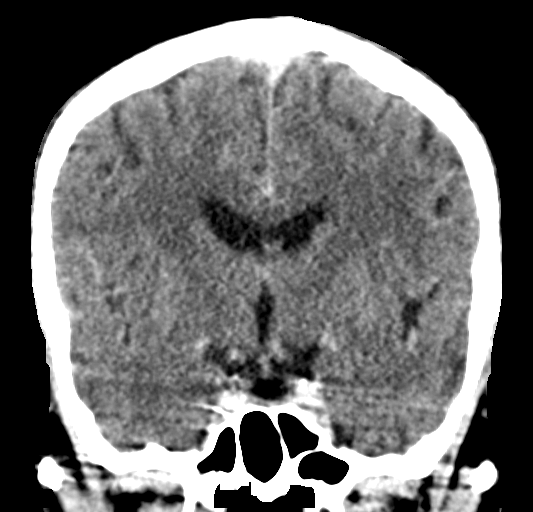

[Series 6: sagittal soft tissue · sagittal · 0.29mm/px · 3 of 49 slices shown]
[im 17/49  brain]
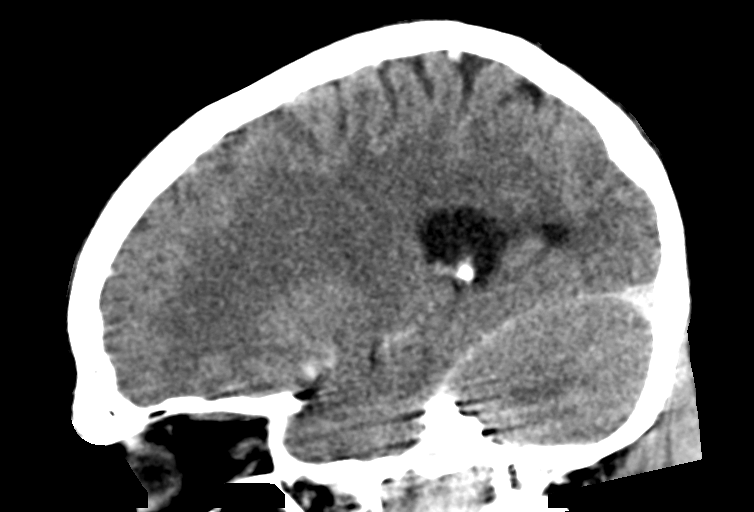
[im 25/49  brain]
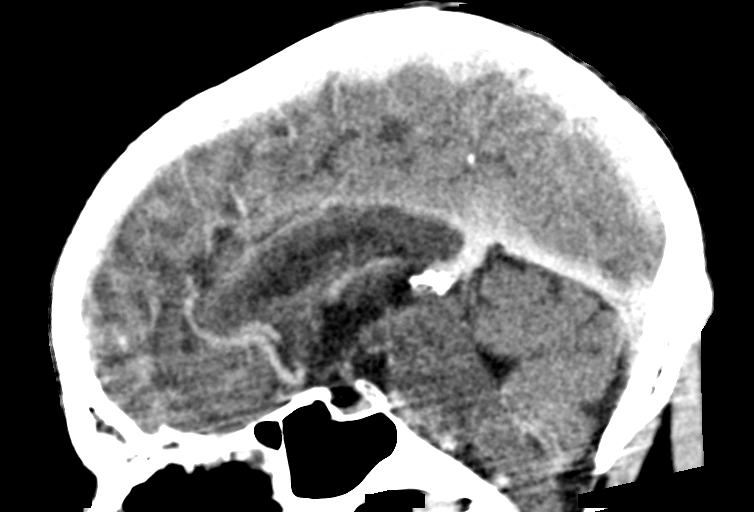
[im 33/49  brain]
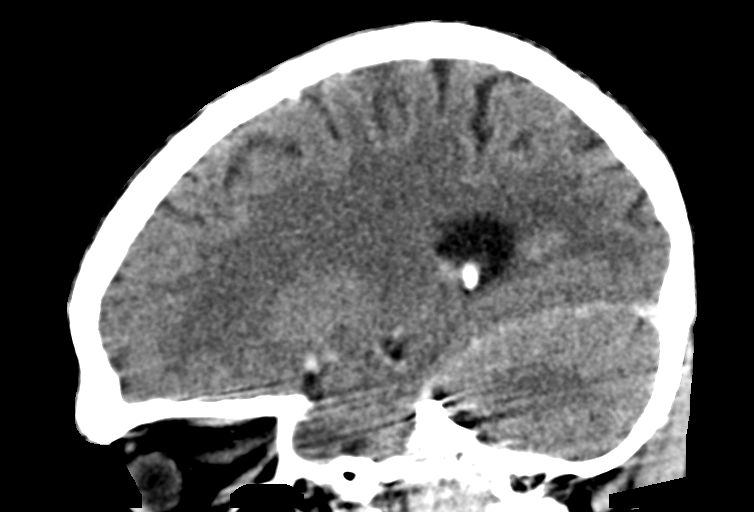

[15 of 47 positions shown; findings below may reference images not displayed]

FINDINGS: Brain: No evidence of acute infarction, hemorrhage, hydrocephalus,
extra-axial collection or mass lesion/mass effect.

Vascular: No hyperdense vessel or unexpected calcification. Visible
vessels are patent.

Skull: Sclerosis in the clivus which is stable. No visible skull
base erosion.

Sinuses/Orbits: Moderate mucosal thickening greatest in the
ethmoids.

Other: history of nasopharyngeal carcinoma with prominent submucosal
enhancing nasopharyngeal soft tissue asymmetric to the left. This
appearance is stable if not somewhat regressed from comparison neck
CT.
IMPRESSION: No acute intracranial finding. No evidence of intracranial
metastatic disease.

## 2020-06-30 NOTE — Telephone Encounter (Signed)
To close telephone encounter
